# Patient Record
Sex: Male | Born: 1960 | Race: White | Hispanic: No | Smoking: Current every day smoker
Health system: Southern US, Community
[De-identification: ages and names within clinical notes are randomized; demographics above are authoritative.]

## PROBLEM LIST (undated history)

## (undated) DIAGNOSIS — R931 Abnormal findings on diagnostic imaging of heart and coronary circulation: Secondary | ICD-10-CM

## (undated) DIAGNOSIS — E785 Hyperlipidemia, unspecified: Secondary | ICD-10-CM

## (undated) DIAGNOSIS — I519 Heart disease, unspecified: Secondary | ICD-10-CM

## (undated) HISTORY — DX: Abnormal findings on diagnostic imaging of heart and coronary circulation: R93.1

## (undated) HISTORY — DX: Heart disease, unspecified: I51.9

## (undated) HISTORY — PX: VASECTOMY: SHX75

## (undated) HISTORY — DX: Hyperlipidemia, unspecified: E78.5

---

## 2005-09-04 ENCOUNTER — Ambulatory Visit: Payer: Self-pay | Admitting: Family Medicine

## 2005-10-06 ENCOUNTER — Ambulatory Visit: Payer: Self-pay | Admitting: Cardiology

## 2005-10-16 ENCOUNTER — Ambulatory Visit: Payer: Self-pay | Admitting: Cardiology

## 2006-05-06 ENCOUNTER — Ambulatory Visit: Payer: Self-pay | Admitting: Family Medicine

## 2007-08-11 ENCOUNTER — Ambulatory Visit: Payer: Self-pay | Admitting: Family Medicine

## 2008-03-16 ENCOUNTER — Ambulatory Visit: Payer: Self-pay | Admitting: Family Medicine

## 2008-08-21 ENCOUNTER — Ambulatory Visit: Payer: Self-pay | Admitting: Family Medicine

## 2008-09-11 ENCOUNTER — Ambulatory Visit: Payer: Self-pay | Admitting: Family Medicine

## 2009-01-03 ENCOUNTER — Ambulatory Visit: Payer: Self-pay | Admitting: Family Medicine

## 2009-10-28 ENCOUNTER — Ambulatory Visit: Payer: Self-pay | Admitting: Family Medicine

## 2009-11-19 ENCOUNTER — Ambulatory Visit: Payer: Self-pay | Admitting: Family Medicine

## 2009-11-20 ENCOUNTER — Encounter (INDEPENDENT_AMBULATORY_CARE_PROVIDER_SITE_OTHER): Payer: Self-pay | Admitting: *Deleted

## 2009-11-22 ENCOUNTER — Encounter (INDEPENDENT_AMBULATORY_CARE_PROVIDER_SITE_OTHER): Payer: Self-pay | Admitting: *Deleted

## 2009-11-28 ENCOUNTER — Ambulatory Visit: Payer: Self-pay | Admitting: Internal Medicine

## 2009-12-03 ENCOUNTER — Telehealth: Payer: Self-pay | Admitting: Internal Medicine

## 2010-04-16 ENCOUNTER — Ambulatory Visit
Admission: RE | Admit: 2010-04-16 | Discharge: 2010-04-16 | Payer: Self-pay | Source: Home / Self Care | Attending: Family Medicine | Admitting: Family Medicine

## 2010-05-01 NOTE — Letter (Signed)
Summary: [facility] Instructions  [facility]  [address] [place], [place] [address]   Phone: [phone]  Fax: [phone]       David Jensen    1961-02-14    MRN: [id number]        Procedure Day /Date: Thursday 12-05-09     Arrival Time: 8:30 a.m.     Procedure Time: 9:30 a.m.     Location of Procedure:                    _x_  [facility] (4th Floor)                        PREPARATION FOR COLONOSCOPY WITH MOVIPREP   Starting 5 days prior to your procedure 11-30-09 do not eat nuts, seeds, popcorn, corn, beans, peas,  salads, or any raw vegetables.  Do not take any fiber supplements (e.g. Metamucil, Citrucel, and Benefiber).  THE DAY BEFORE YOUR PROCEDURE         DATE:  12-04-09  DAY:  Wednesday  1.  Drink clear liquids the entire day-NO SOLID FOOD  2.  Do not drink anything colored red or purple.  Avoid juices with pulp.  No orange juice.  3.  Drink at least 64 oz. (8 glasses) of fluid/clear liquids during the day to prevent dehydration and help the prep work efficiently.  CLEAR LIQUIDS INCLUDE: Water Jello Ice Popsicles Tea (sugar ok, no milk/cream) Powdered fruit flavored drinks Coffee (sugar ok, no milk/cream) Gatorade Juice: apple, white grape, white cranberry  Lemonade Clear bullion, consomm, broth Carbonated beverages (any kind) Strained chicken noodle soup Hard Candy                             4.  In the morning, mix first dose of MoviPrep solution:    Empty 1 Pouch A and 1 Pouch B into the disposable container    Add lukewarm drinking water to the top line of the container. Mix to dissolve    Refrigerate (mixed solution should be used within 24 hrs)  5.  Begin drinking the prep at 5:00 p.m. The MoviPrep container is divided by 4 marks.   Every 15 minutes drink the solution down to the next mark (approximately 8 oz) until the full liter is complete.   6.  Follow completed prep with 16 oz of clear liquid of your choice  (Nothing red or purple).  Continue to drink clear liquids until bedtime.  7.  Before going to bed, mix second dose of MoviPrep solution:    Empty 1 Pouch A and 1 Pouch B into the disposable container    Add lukewarm drinking water to the top line of the container. Mix to dissolve    Refrigerate  THE DAY OF YOUR PROCEDURE      DATE:  12-05-09  DAY:  Thursday  Beginning at  4:30 a.m. (5 hours before procedure):         1. Every 15 minutes, drink the solution down to the next mark (approx 8 oz) until the full liter is complete.  2. Follow completed prep with 16 oz. of clear liquid of your choice.    3. You may drink clear liquids until  7:30 a.m. (2 HOURS BEFORE PROCEDURE).   MEDICATION INSTRUCTIONS  Unless otherwise instructed, you should take regular prescription medications with a small sip of water   as  early as possible the morning of your procedu      OTHER INSTRUCTIONS  You will need a responsible adult at least 51 years of age to accompany you and drive you home.   This person must remain in the waiting room during your procedure.  Wear loose fitting clothing that is easily removed.  Leave jewelry and other valuables at home.  However, you may wish to bring a book to read or  an iPod/MP3 player to listen to music as you wait for your procedure to start.  Remove all body piercing jewelry and leave at home.  Total time from sign-in until discharge is approximately 2-3 hours.  You should go home directly after your procedure and rest.  You can resume normal activities the  day after your procedure.  The day of your procedure you should not:   Drive   Make legal decisions   Operate machinery   Drink alcohol   Return to work  You will receive specific instructions about eating, activities and medications before you leave.    The above instructions have been reviewed and explained to me by   Doristine Church RN II  November 28, 2009 2:52 PM    I fully  understand and can verbalize these instructions _____________________________ Date _________

## 2010-05-01 NOTE — Progress Notes (Signed)
Summary: cx fee?  Phone Note Call from Patient   Caller: Patient Call For: Dr. Leone Payor Summary of Call: pt canceled COL sch'ed for 9/8 due to insurace coverage concern... pt states he tried calling yesterday, but our office was closed Dr. Leone Payor, do you wish to charge this pt a cx fee? Initial call taken by: Vallarie Mare,  December 03, 2009 9:56 AM  Follow-up for Phone Call        No but I want to know what the coverage issue is  to understand   is it deductible?  Follow-up by: Iva Boop MD, Clementeen Graham,  December 03, 2009 10:10 AM  Additional Follow-up for Phone Call Additional follow up Details #1::        Dr. Leone Payor, Coverage and benefits were verified and explained to pt by our office.  Pt has a $933 deductible and then his ins will only cover 70% of charges if the procedure becomes diagnostic.  Pt said he cannot afford his deductible and the possibility of only 70% coverage. We will not charge. Additional Follow-up by: Vallarie Mare,  December 04, 2009 8:27 AM    Additional Follow-up for Phone Call Additional follow up Details #2::    Patient NOT BILLED. Follow-up by: Leanor Kail [facility],  December 04, 2009 8:30 AM

## 2010-05-01 NOTE — Letter (Signed)
Summary: Previsit letter  [facility]  [address] [place], [place] [address]   Phone: [phone]  Fax: [phone]       11/20/2009 MRN: [id number]  David Jensen [address] [place], [place]  [address]  Dear [name],  Welcome to the Gastroenterology Division at [facility].    You are scheduled to see a nurse for your pre-procedure visit on 11-27-09 at 9:00a.m. on the 3rd floor at [facility], [address]  We ask that you try to arrive at our office 15 minutes prior to your appointment time to allow for check-in.  Your nurse visit will consist of discussing your medical and surgical history, your immediate family medical history, and your medications.    Please bring a complete list of all your medications or, if you prefer, bring the medication bottles and we will list them.  We will need to be aware of both prescribed and over the counter drugs.  We will need to know exact dosage information as well.  If you are on blood thinners (Coumadin, Plavix, Aggrenox, Ticlid, etc.) please call our office today/prior to your appointment, as we need to consult with your physician about holding your medication.   Please be prepared to read and sign documents such as consent forms, a financial agreement, and acknowledgement forms.  If necessary, and with your consent, a friend or relative is welcome to sit-in on the nurse visit with you.  Please bring your insurance card so that we may make a copy of it.  If your insurance requires a referral to see a specialist, please bring your referral form from your primary care physician.  No co-pay is required for this nurse visit.     If you cannot keep your appointment, please call [phone] to cancel or reschedule prior to your appointment date.  This allows [place] the opportunity to schedule an appointment for another patient in need of care.    Thank you for choosing [facility] for your medical  needs.  We appreciate the opportunity to care for you.  Please visit [place] at our website  to learn more about our practice.                     Sincerely.                                                                                                                   The Gastroenterology Division

## 2010-05-01 NOTE — Miscellaneous (Signed)
Summary: [facility] previsit  Clinical Lists Changes  Medications: Added new medication of MOVIPREP 100 GM  SOLR (PEG-KCL-NACL-NASULF-NA ASC-C) As per prep instructions. - Signed Rx of MOVIPREP 100 GM  SOLR (PEG-KCL-NACL-NASULF-NA ASC-C) As per prep instructions.;  #1 x 0;  Signed;  Entered by: Doristine Church RN II;  Authorized by: Iva Boop MD, [facility];  Method used: Electronically to [facility] [address], [place], [place]  [address], Ph: [phone], Fax: [phone] Allergies: Added new allergy or adverse reaction of CODEINE Observations: Added new observation of ALLERGY REV: Done (11/28/2009 14:30) Added new observation of NKA: F (11/28/2009 14:30)    Prescriptions: MOVIPREP 100 GM  SOLR (PEG-KCL-NACL-NASULF-NA ASC-C) As per prep instructions.  #1 x 0   Entered by:   Doristine Church RN II   Authorized by:   Iva Boop MD, [facility]   Signed by:   Doristine Church RN II on 11/28/2009   Method used:   Electronically to        [facility]* (retail)       [address]       [place], [place]  [address]       Ph: [phone]       Fax: [phone]   RxID:   [id number]

## 2010-08-15 NOTE — Procedures (Signed)
[facility]                                EXERCISE TREADMILL   NAME:David Jensen, David Jensen                       MRN:          [id number]  DATE:10/16/2005                            DOB:          Sep 30, 1960    PROCEDURE:  Exercise treadmill test.   OPERATOR:  Rollene Rotunda, M.D.   INDICATIONS FOR PROCEDURE:  A patient with chest discomfort and a family  history of early heart disease.   DESCRIPTION OF PROCEDURE:  The patient was exercised using the standard  Bruce protocol.  He was able to exercise for 10 minutes.  This was one  minute into stage 4.  He achieved 11.7 METS.  He achieved a peak heart rate  of 166, which is 95% of predicted.  The test was terminated because of  shortness of breath and because he had achieved his target heart rate.  He  did not have any chest discomfort.  He did have an appropriate blood  pressure response with a maximum of 151/76.  There was no ectopy noted.  He  did have some very slight 0.5 mm ST segment depression that was slightly up-  sloping in lead II and aVF, horizontal only in lead III.  It did not meet  criteria for an ischemic response.  He had a normal heart rate in recovery.   CONCLUSION:  Negative adequate exercise treadmill test.  Good exercise  tolerance.  No evidence of high-grade obstructive coronary disease.  Low  risk stratification, based on this test.   PLAN:  Given this test, the patient is in the lower risk category for future  cardiovascular events; however, he does have a strong family history with  his father having heart disease in his 91's and dying suddenly in his early  49's.  Given this, we have discussed at great length pimary risk reduction.  I have given him a prescription for exercise based on this study.  I have  also suggested Lipitor, which he is using.  He knows that he needs a lipid  profile in about eight weeks with liver enzymes.  I have  goal of less than  100.   FOLLOWUP:   I would like to see him back in one month or sooner if needed.                                   Rollene Rotunda, MD, [facility]   JH/MedQ  DD:  10/16/2005  DT:  10/16/2005  Job #:  [id number]   cc:   Sharlot Gowda, MD

## 2010-08-15 NOTE — Assessment & Plan Note (Signed)
[facility]                              CARDIOLOGY OFFICE NOTE   FAIZ, WEBER                       MRN:          [id number]  DATE:10/06/2005                            DOB:          22-Nov-1960    REFERRING PHYSICIAN:  Carollee Herter   PRIMARY CARE PHYSICIAN:  Sharlot Gowda, MD   REASON FOR REFERRAL:  Evaluate patient with a family history of coronary  artery disease and an abnormal EKG.   HISTORY OF PRESENT ILLNESS:  The patient is a very pleasant 50 year old  white gentleman, no prior cardiac history.  His father did die at age 29 of  a myocardial infarction and he had heart disease before that.  This  gentleman does not exercise routinely, though he has what he describes as a  sometimes active job.  He will climb ladders, amongst his activities.  He  denies any chest discomfort, neck discomfort, arm discomfort, activity-  induced nausea and vomiting, excessive diaphoresis.  He has had no  palpitations, presyncope or syncope.  He denies any PND or orthopnea.   PAST MEDICAL HISTORY:  He says he had a recent lipid profile and thinks it  was okay but does not remember the numbers.  He has had no history of  diabetes or hypertension.   PAST SURGICAL HISTORY:  Vasectomy.   ALLERGIES:  CODEINE.   MEDICATIONS:  Advil.   SOCIAL HISTORY:  The patient quit smoking in 1986 after smoking for about 10  years.  He does smoke a pipe currently.  He is married.  He has 2 teenage  children.  He is a Surveyor, minerals.  He likes to golf.   FAMILY HISTORY:  Positive for coronary disease in his father as described.   REVIEW OF SYSTEMS:  Negative for all other systems.   PHYSICAL EXAMINATION:  GENERAL:  The patient is in no distress.  VITAL SIGNS:  Blood pressure 120/84, heart rate 93 and regular, weight 215  pounds, body mass index 27.  HEENT:  Eyelids unremarkable.  Pupils equal, round, and reactive to light.  Fundi within normal limits.  Oral  mucosa normal.  NECK:  No jugular venous distention, wave form within normal limits, carotid  upstroke brisk and symmetric, no bruits, no thyromegaly.  LYMPHATIC:  No cervical, axillary or inguinal adenopathy.  LUNGS:  Clear to auscultation bilaterally.  BACK:  No costovertebral angle tenderness.  CHEST:  Unremarkable.  CARDIAC:  PMI not displaced or sustained, S1 and S2 within normal limits, no  S3, no S4, no murmurs.  ABDOMEN:  Flat, positive bowel sounds, normal in frequency and pitch, no  bruits, rebound, guarding, no midline pulsatile mass, no hepatomegaly, no  splenomegaly.  SKIN:  No rashes, no nodules.  EXTREMITIES:  2+ pulses throughout.  No edema, no cyanosis, no clubbing.  NEUROLOGIC:  Oriented to person, place, time.  Cranial nerves II-XII grossly  intact.  Motor grossly intact.   EKG:  Sinus rhythm, rate 93, axis within normal limits, intervals within  normal limits, early repolarization pattern, no acute ST-T wave changes.   ASSESSMENT  AND PLAN:  1.  Cardiovascular risk.  The patient does have significant family history.      He does not exercise.  He has no objective findings of ischemia or      symptoms suggestive of a high-grade obstructive lesion.  However, an      exercise treadmill test is indicated for screening and risk reduction.      I am also with this going to give him an exercise regimen.  He will be      scheduled to come back for this.  I do think his EKG represents      repolarization changes but not an acute finding.  I do think he will      benefit substantially from the above-mentioned screening and plans for      preventive maintenance.  2.  Weight.  We discussed the fact that his body mass index is above 27 and      we discussed goals for mild weight loss.  3.  Tobacco.  I have asked him to avoid all tobacco products.  4.  Lipids.  I will review his lipid profile when he brings it back at the      time of his treadmill.  Given his family history, I  would suggest an      aggressive goal  of an LDL less than 100 and an HDL greater than 40.                               Rollene Rotunda, MD, [facility]    JH/MedQ  DD:  10/06/2005  DT:  10/06/2005  Job #:  [id number]   cc:   Sharlot Gowda, MD

## 2010-12-19 ENCOUNTER — Telehealth: Payer: Self-pay | Admitting: Family Medicine

## 2010-12-19 DIAGNOSIS — E785 Hyperlipidemia, unspecified: Secondary | ICD-10-CM

## 2010-12-19 MED ORDER — ATORVASTATIN CALCIUM 20 MG PO TABS: 20.0000 mg | ORAL_TABLET | Freq: Every day | ORAL | Status: DC

## 2010-12-19 NOTE — Telephone Encounter (Signed)
Received refill request for patient's atorvastatin 20mg , called patient and let him know that I was going to call him in one month's supply as he was due for CPE with fasting labs in August. Patient states he will check his schedule and call me back later today to schedule CPE with Dr.Lalonde. I called in atorvastain 20mg  #30 qd with 0rf to [facility].

## 2011-01-09 ENCOUNTER — Encounter: Payer: Self-pay | Admitting: Family Medicine

## 2011-01-21 ENCOUNTER — Other Ambulatory Visit: Payer: Self-pay | Admitting: Family Medicine

## 2011-01-21 DIAGNOSIS — E785 Hyperlipidemia, unspecified: Secondary | ICD-10-CM

## 2011-01-21 MED ORDER — ATORVASTATIN CALCIUM 20 MG PO TABS: 20.0000 mg | ORAL_TABLET | Freq: Every day | ORAL | Status: DC

## 2011-01-21 NOTE — Telephone Encounter (Signed)
He needs to come in for blood work 

## 2011-01-21 NOTE — Telephone Encounter (Signed)
Patient scheduled Physical for 02/11/11, needs Lipitor refill on a few more pills left

## 2011-02-11 ENCOUNTER — Ambulatory Visit (INDEPENDENT_AMBULATORY_CARE_PROVIDER_SITE_OTHER): Payer: BC Managed Care – PPO | Admitting: Family Medicine

## 2011-02-11 ENCOUNTER — Encounter: Payer: Self-pay | Admitting: Family Medicine

## 2011-02-11 VITALS — BP 122/82 | HR 80 | Ht 75.5 in | Wt 209.0 lb

## 2011-02-11 DIAGNOSIS — E785 Hyperlipidemia, unspecified: Secondary | ICD-10-CM

## 2011-02-11 DIAGNOSIS — Z0000 Encounter for general adult medical examination without abnormal findings: Secondary | ICD-10-CM

## 2011-02-11 DIAGNOSIS — F909 Attention-deficit hyperactivity disorder, unspecified type: Secondary | ICD-10-CM | POA: Insufficient documentation

## 2011-02-11 DIAGNOSIS — R4702 Dysphasia: Secondary | ICD-10-CM

## 2011-02-11 DIAGNOSIS — Z8249 Family history of ischemic heart disease and other diseases of the circulatory system: Secondary | ICD-10-CM

## 2011-02-11 DIAGNOSIS — Z125 Encounter for screening for malignant neoplasm of prostate: Secondary | ICD-10-CM

## 2011-02-11 DIAGNOSIS — Z23 Encounter for immunization: Secondary | ICD-10-CM

## 2011-02-11 DIAGNOSIS — R4789 Other speech disturbances: Secondary | ICD-10-CM

## 2011-02-11 LAB — POCT URINALYSIS DIPSTICK
Bilirubin, UA: NEGATIVE
Blood, UA: NEGATIVE
Glucose, UA: NEGATIVE
Ketones, UA: NEGATIVE
Leukocytes, UA: NEGATIVE
Nitrite, UA: NEGATIVE
Protein, UA: NEGATIVE
Spec Grav, UA: 1.025
Urobilinogen, UA: NEGATIVE
pH, UA: 5

## 2011-02-11 MED ORDER — METHYLPHENIDATE HCL ER (LA) 30 MG PO CP24: 30.0000 mg | ORAL_CAPSULE | ORAL | Status: DC

## 2011-02-11 NOTE — Patient Instructions (Signed)
I will call you with the results of the blood work.  

## 2011-02-11 NOTE — Progress Notes (Signed)
Subjective:    Patient ID: David Jensen, male    DOB: Apr 06, 1960, 50 y.o.   MRN: [id number]  HPI He has a two-week history of clicking sensation when he swallows but it does not bother him when he swallows liquids or solids him in mainly with clearing his throat. He also has some left-sided neck pain unrelated to swallowing. Breathing and movement make no difference. He has a previous history of ADD as well as 2 of his children having this. He did try Ritalin 20 mg but states it did not help much. I can have some on hand for occasions that he needs to stay focused for the He also has some skin lesions that he would like to evaluate continues on Lipitor. His marriage is stable. His 2 children are now out of the home.   Review of Systems  Constitutional: Negative.   HENT: Positive for neck pain.   Eyes: Negative.   Respiratory: Negative.   Cardiovascular: Negative.   Genitourinary: Negative.   Skin: Negative.   Psychiatric/Behavioral: Negative.        Objective:   Physical Exam BP 122/82  Pulse 80  Ht 6' 3.5" (1.918 m)  Wt 209 lb (94.802 kg)  BMI 25.78 kg/m2  General Appearance:    Alert, cooperative, no distress, appears stated age  Head:    Normocephalic, without obvious abnormality, atraumatic  Eyes:    PERRL, conjunctiva/corneas clear, EOM's intact, fundi    benign  Ears:    Normal TM's and external ear canals  Nose:   Nares normal, mucosa normal, no drainage or sinus   tenderness  Throat:   Lips, mucosa, and tongue normal; teeth and gums normal  Neck:   Supple, no lymphadenopathy;  thyroid:  no   enlargement/tenderness/nodules; no carotid   bruit or JVD  Back:    Spine nontender, no curvature, ROM normal, no CVA     tenderness  Lungs:     Clear to auscultation bilaterally without wheezes, rales or     ronchi; respirations unlabored  Chest Wall:    No tenderness or deformity   Heart:    Regular rate and rhythm, S1 and S2 normal, no murmur, rub   or gallop  Breast Exam:     No chest wall tenderness, masses or gynecomastia  Abdomen:     Soft, non-tender, nondistended, normoactive bowel sounds,    no masses, no hepatosplenomegaly  Genitalia:   deferred   Rectal:   deferred   Extremities:   No clubbing, cyanosis or edema  Pulses:   2+ and symmetric all extremities  Skin:   Skin color, texture, turgor normal, no rashes or lesions  Lymph nodes:   Cervical, supraclavicular, and axillary nodes normal  Neurologic:   CNII-XII intact, normal strength, sensation and gait; reflexes 2+ and symmetric throughout          Psych:   Normal mood, affect, hygiene and grooming.          Assessment & Plan:   1. Dysphasia  Ambulatory referral to Gastroenterology  2. ADD (attention deficit disorder with hyperactivity)    3. Special screening for malignant neoplasm of prostate  PSA  4. Family history of heart disease in male family member before age 67  CBC with Differential, Comprehensive metabolic panel, Ambulatory referral to Cardiology  5. Hyperlipidemia LDL goal <100  Lipid panel  6. Routine general medical examination at a health care facility  HM COLONOSCOPY, PSA, CBC with Differential, Comprehensive metabolic panel,  Lipid panel   prescription for Ritalin written. He will let me know how this works.

## 2011-02-11 NOTE — Progress Notes (Signed)
Addended by: Barbette Or A on: 02/11/2011 12:37 PM   Modules accepted: Orders

## 2011-02-12 LAB — CBC WITH DIFFERENTIAL/PLATELET
HCT: 49 % (ref 39.0–52.0)
Hemoglobin: 16.5 g/dL (ref 13.0–17.0)
Lymphocytes Relative: 23 % (ref 12–46)
Lymphs Abs: 1.2 10*3/uL (ref 0.7–4.0)
MCHC: 33.7 g/dL (ref 30.0–36.0)
Monocytes Absolute: 0.4 10*3/uL (ref 0.1–1.0)
Monocytes Relative: 8 % (ref 3–12)
Neutro Abs: 3.6 10*3/uL (ref 1.7–7.7)
Neutrophils Relative %: 66 % (ref 43–77)
RBC: 4.75 MIL/uL (ref 4.22–5.81)
WBC: 5.5 10*3/uL (ref 4.0–10.5)

## 2011-02-12 LAB — LIPID PANEL
HDL: 56 mg/dL (ref >39)
LDL Cholesterol: 95 mg/dL (ref 0–99)
Triglycerides: 61 mg/dL (ref <150)
VLDL: 12 mg/dL (ref 0–40)

## 2011-02-12 LAB — PSA: PSA: 0.34 ng/mL (ref <=4.00)

## 2011-02-12 LAB — COMPREHENSIVE METABOLIC PANEL
Albumin: 4.5 g/dL (ref 3.5–5.2)
BUN: 16 mg/dL (ref 6–23)
CO2: 26 mEq/L (ref 19–32)
Calcium: 9.4 mg/dL (ref 8.4–10.5)
Chloride: 104 mEq/L (ref 96–112)
Glucose, Bld: 85 mg/dL (ref 70–99)
Potassium: 4.5 mEq/L (ref 3.5–5.3)
Sodium: 139 mEq/L (ref 135–145)
Total Protein: 6.9 g/dL (ref 6.0–8.3)

## 2011-02-18 ENCOUNTER — Telehealth: Payer: Self-pay | Admitting: Family Medicine

## 2011-02-18 NOTE — Telephone Encounter (Signed)
DONE

## 2011-03-03 ENCOUNTER — Other Ambulatory Visit: Payer: Self-pay | Admitting: Internal Medicine

## 2011-03-03 DIAGNOSIS — E785 Hyperlipidemia, unspecified: Secondary | ICD-10-CM

## 2011-03-03 MED ORDER — ATORVASTATIN CALCIUM 20 MG PO TABS: 20.0000 mg | ORAL_TABLET | Freq: Every day | ORAL | Status: DC

## 2011-03-19 ENCOUNTER — Ambulatory Visit (INDEPENDENT_AMBULATORY_CARE_PROVIDER_SITE_OTHER): Payer: BC Managed Care – PPO | Admitting: Cardiology

## 2011-03-19 ENCOUNTER — Encounter: Payer: Self-pay | Admitting: Cardiology

## 2011-03-19 VITALS — BP 142/92 | HR 106 | Resp 16 | Ht 75.0 in | Wt 212.0 lb

## 2011-03-19 DIAGNOSIS — R9431 Abnormal electrocardiogram [ECG] [EKG]: Secondary | ICD-10-CM

## 2011-03-19 NOTE — Patient Instructions (Addendum)
Your physician has requested that you have cardiac Calcium Score. Cardiac computed tomography (CT) is a painless test that uses an x-ray machine to take clear, detailed pictures of your heart. For further information please visit [email or web address] Please follow instruction sheet as given.  Please have blood work today (TSH)  Follow up will be based on the results of your testing.

## 2011-03-19 NOTE — Assessment & Plan Note (Signed)
He is slightly tachycardic. I will check a TSH.

## 2011-03-19 NOTE — Assessment & Plan Note (Signed)
I reviewed his LDL which is 95. HDL was 56. Think is a reasonable ratio and he will continue meds as listed.

## 2011-03-19 NOTE — Assessment & Plan Note (Signed)
I think a reasonable screen with this gentleman would be a coronary calcium score. We discussed this and primary risk reduction.

## 2011-03-19 NOTE — Progress Notes (Signed)
    HPI The patient presents for followup of cardiovascular risk factors. I saw him in 2007. He had an exercise treadmill test which was normal. Since that time he's had no acute cardiovascular events. However, he has significant cardiovascular risk factors with dyslipidemia and a family history. He doesn't exercise routinely though he's active walking stairs and doing daily living activities. The patient denies any new symptoms such as chest discomfort, neck or arm discomfort. There has been no new shortness of breath, PND or orthopnea. There have been no reported palpitations, presyncope or syncope.    Allergies  Allergen Reactions  . Codeine     REACTION: n/v    Current Outpatient Prescriptions  Medication Sig Dispense Refill  . atorvastatin (LIPITOR) 20 MG tablet Take 1 tablet (20 mg total) by mouth daily.  30 tablet  5  . Multiple Vitamins-Minerals (MULTIVITAMIN WITH MINERALS) tablet Take 1 tablet by mouth daily.          Past Medical History  Diagnosis Date  . Dyslipidemia   . Cardiac disease     FAMILY HX    Past Surgical History  Procedure Date  . Vasectomy     Family History  Problem Relation Age of Onset  . Heart disease Father 52    Died with an MI  . Atrial fibrillation Mother     History   Social History  . Marital Status: Married    Spouse Name: N/A    Number of Children: 2  . Years of Education: N/A   Occupational History  . Realtor    Social History Main Topics  . Smoking status: Current Everyday Smoker    Types: Pipe  . Smokeless tobacco: Not on file  . Alcohol Use: Yes  . Drug Use: No  . Sexually Active: Yes   Other Topics Concern  . Not on file   Social History Narrative  . No narrative on file    ROS:  As stated in the HPI and negative for all other systems.  PHYSICAL EXAM BP 142/92  Pulse 106  Resp 16  Ht 6\' 3"  (1.905 m)  Wt 212 lb (96.163 kg)  BMI 26.50 kg/m2 GENERAL:  Well appearing HEENT:  Pupils equal round and reactive,  fundi not visualized, oral mucosa unremarkable NECK:  No jugular venous distention, waveform within normal limits, carotid upstroke brisk and symmetric, no bruits, no thyromegaly LYMPHATICS:  No cervical, inguinal adenopathy LUNGS:  Clear to auscultation bilaterally BACK:  No CVA tenderness CHEST:  Unremarkable HEART:  PMI not displaced or sustained,S1 and S2 within normal limits, no S3, no S4, no clicks, no rubs, no murmurs ABD:  Flat, positive bowel sounds normal in frequency in pitch, no bruits, no rebound, no guarding, no midline pulsatile mass, no hepatomegaly, no splenomegaly EXT:  2 plus pulses throughout, no edema, no cyanosis no clubbing SKIN:  No rashes no nodules NEURO:  Cranial nerves II through XII grossly intact, motor grossly intact throughout [facility]:  Cognitively intact, oriented to person place and time  EKG:   Sinus rhythm, rate 104, poor anterior R wave progression, no acute ST-T wave changes, rightward axis 03/19/2011   ASSESSMENT AND PLAN

## 2011-04-11 ENCOUNTER — Telehealth: Payer: Self-pay | Admitting: Cardiology

## 2011-04-11 NOTE — Telephone Encounter (Signed)
Message copied by Rollene Rotunda on Sat Apr 11, 2011 11:51 AM ------      Message from: FLEMING, PAMELA J      Created: Thu Apr 09, 2011  8:40 AM      Regarding: FW: CT CALCIUM SORE                   ----- Message -----         From: Connye Burkitt, NT         Sent: 04/08/2011  10:51 AM           To: Rocco Serene, RN      Subject: FW: CT CALCIUM SORE                                      I called David Jensen this morning to see if he wanted to schedule Cardiac CT Calcium Score. Patient has decided not to have study at this time due to cost. Patient states he just can't afford it right now.      ----- Message -----         From: Connye Burkitt, NT         Sent: 03/19/2011   4:38 PM           To: Rocco Serene, RN, Connye Burkitt, NT      Subject: RE: CT CALCIUM SORE                                                  ----- Message -----         From: Connye Burkitt, NT         Sent: 03/19/2011   3:05 PM           To: Rocco Serene, RN, Connye Burkitt, NT      Subject: CT CALCIUM SORE                                          PATIENT WILL CALL ME BACK TO SCHEDULE CT.

## 2011-05-22 LAB — HM COLONOSCOPY

## 2011-06-19 ENCOUNTER — Telehealth: Payer: Self-pay | Admitting: Family Medicine

## 2011-06-19 MED ORDER — METHYLPHENIDATE HCL ER (LA) 30 MG PO CP24: 30.0000 mg | ORAL_CAPSULE | ORAL | Status: DC

## 2011-06-19 NOTE — Telephone Encounter (Signed)
DONE

## 2011-06-19 NOTE — Telephone Encounter (Signed)
Return in [place] written.

## 2011-09-28 ENCOUNTER — Other Ambulatory Visit: Payer: Self-pay

## 2011-09-28 DIAGNOSIS — E785 Hyperlipidemia, unspecified: Secondary | ICD-10-CM

## 2011-09-28 MED ORDER — ATORVASTATIN CALCIUM 20 MG PO TABS: 20.0000 mg | ORAL_TABLET | Freq: Every day | ORAL | Status: DC

## 2011-11-04 ENCOUNTER — Telehealth: Payer: Self-pay | Admitting: Internal Medicine

## 2011-11-04 ENCOUNTER — Encounter: Payer: Self-pay | Admitting: Family Medicine

## 2011-11-04 ENCOUNTER — Ambulatory Visit (INDEPENDENT_AMBULATORY_CARE_PROVIDER_SITE_OTHER): Payer: BC Managed Care – PPO | Admitting: Family Medicine

## 2011-11-04 VITALS — BP 130/80 | HR 99 | Wt 205.0 lb

## 2011-11-04 DIAGNOSIS — F909 Attention-deficit hyperactivity disorder, unspecified type: Secondary | ICD-10-CM

## 2011-11-04 DIAGNOSIS — Z79899 Other long term (current) drug therapy: Secondary | ICD-10-CM

## 2011-11-04 DIAGNOSIS — E785 Hyperlipidemia, unspecified: Secondary | ICD-10-CM

## 2011-11-04 DIAGNOSIS — Z8249 Family history of ischemic heart disease and other diseases of the circulatory system: Secondary | ICD-10-CM

## 2011-11-04 DIAGNOSIS — L989 Disorder of the skin and subcutaneous tissue, unspecified: Secondary | ICD-10-CM

## 2011-11-04 LAB — LIPID PANEL
Cholesterol: 170 mg/dL (ref 0–200)
HDL: 50 mg/dL (ref >39)
LDL Cholesterol: 107 mg/dL — ABNORMAL HIGH (ref 0–99)
Total CHOL/HDL Ratio: 3.4 Ratio
Triglycerides: 67 mg/dL (ref <150)
VLDL: 13 mg/dL (ref 0–40)

## 2011-11-04 LAB — COMPREHENSIVE METABOLIC PANEL
ALT: 13 U/L (ref 0–53)
AST: 15 U/L (ref 0–37)
Alkaline Phosphatase: 50 U/L (ref 39–117)
BUN: 15 mg/dL (ref 6–23)
Calcium: 9.4 mg/dL (ref 8.4–10.5)
Creat: 0.93 mg/dL (ref 0.50–1.35)
Total Bilirubin: 0.7 mg/dL (ref 0.3–1.2)

## 2011-11-04 MED ORDER — ATORVASTATIN CALCIUM 20 MG PO TABS: 20.0000 mg | ORAL_TABLET | Freq: Every day | ORAL | Status: DC

## 2011-11-04 NOTE — Telephone Encounter (Signed)
Pt needs a refill on lipitor 20mg 

## 2011-11-04 NOTE — Progress Notes (Signed)
  Subjective:    Patient ID: David Jensen, male    DOB: 12-Jun-1960, 51 y.o.   MRN: [id number]  HPI He is here for medication check and also to have lesions on his upper for head evaluated. He does have underlying ED and does occasionally take Ritalin. He would like a refill. This is rarely used. His family history is significant for heart disease. He would like a refill on his statin drug. He has no other concerns or complaints.   Review of Systems     Objective:   Physical Exam Alert and in no distress. Exam of the right upper forehead area shows 3 one and half centimeter pigmented lesions with well defined borders and consistent pigment.       Assessment & Plan:   1. ADHD (attention deficit hyperactivity disorder)    2. Family history of heart disease in male family member before age 8    3. Hyperlipidemia LDL goal <100  Lipid panel  4. Lesion of skin of face    5. Encounter for long-term (current) use of other medications  Lipid panel, Comprehensive metabolic panel   I reassured him that the lesions were benign and no therapy needed to be done. I will renew his Ritalin. Did recommend he come back within the next year for complete examination.

## 2011-11-04 NOTE — Telephone Encounter (Signed)
Med sent in.

## 2011-11-06 ENCOUNTER — Telehealth: Payer: Self-pay | Admitting: Family Medicine

## 2011-11-12 ENCOUNTER — Telehealth: Payer: Self-pay | Admitting: Family Medicine

## 2011-11-12 NOTE — Telephone Encounter (Signed)
LM

## 2012-04-27 ENCOUNTER — Encounter: Payer: Self-pay | Admitting: Medical

## 2012-04-27 ENCOUNTER — Ambulatory Visit (INDEPENDENT_AMBULATORY_CARE_PROVIDER_SITE_OTHER): Payer: BC Managed Care – PPO | Admitting: Medical

## 2012-04-27 VITALS — BP 138/80 | HR 115 | Temp 99.2°F | Resp 16 | Wt 212.0 lb

## 2012-04-27 DIAGNOSIS — R509 Fever, unspecified: Secondary | ICD-10-CM

## 2012-04-27 DIAGNOSIS — R05 Cough: Secondary | ICD-10-CM

## 2012-04-27 DIAGNOSIS — J329 Chronic sinusitis, unspecified: Secondary | ICD-10-CM

## 2012-04-27 DIAGNOSIS — R059 Cough, unspecified: Secondary | ICD-10-CM

## 2012-04-27 MED ORDER — AZITHROMYCIN 250 MG PO TABS: ORAL_TABLET | ORAL | Status: DC

## 2012-04-27 NOTE — Progress Notes (Signed)
Subjective:  David Jensen is a 52 y.o. male who presents for illness.  His wife is here being seen for the same today.  He reports 1+ week now of worsening illness.  He reports ongoing cough, chest and head congestion, cough productive of green and yellow phlegm consistently now, sinus pressure, fever, throat irritated, no improving despite waiting it out and using OTC Alka Seltzer medication.   Wife has similar symptoms x 2wk and she is not better either.  He is a nonsmoker, no hx/o lung dz or asthma.   No SOB, wheezing, CP, edema. No other aggravating or relieving factors.  No other c/o.  The following portions of the patient's history were reviewed and updated as appropriate: allergies, current medications, past family history, past medical history, past social history, past surgical history and problem list.  Past Medical History  Diagnosis Date  . Dyslipidemia   . Cardiac disease     FAMILY HX   ROS as in subjective  Objective: Vital signs reviewed  General appearance: Alert, WD/WN, no distress, ill appearing                             Skin: warm, no rash, no diaphoresis                           Head: mild sinus tenderness                            Eyes: conjunctiva normal, corneas clear, PERRLA                            Ears: bilat TMs with mild erythema, external ear canals normal                          Nose: septum midline, turbinates swollen, with erythema and clear discharge             Mouth/throat: MMM, tongue normal, mild pharyngeal erythema                           Neck: supple, no adenopathy, no thyromegaly, nontender                          Heart: mildly tachycardic otherwise RRR, normal S1, S2, no murmurs                         Lungs: +scattered rhonchi, no wheezes, no rales, I>E, no dullness to percussion                Extremities: no edema, nontender     Assessment and Plan: Encounter Diagnoses  Name Primary?  . Fever Yes  . Sinusitis   . Cough     Prescription given today for Azithromycin as below.  Discussed diagnosis and treatment of sinus infection, respiratory infection, possibly early lung infection, but no obvious lung exam suggestive of frank pneumonia.  Advised Mucinex DM instead of Alka Seltzer and avoid OTC decongestants and sudafed.  Tylenol or Ibuprofen OTC for fever and malaise.  Call/return in 2-3 days if symptoms are worse or not improving.

## 2012-04-29 ENCOUNTER — Telehealth: Payer: Self-pay | Admitting: Family Medicine

## 2012-04-29 NOTE — Telephone Encounter (Signed)
Patient is aware to stop his cholesterol medication while on the antibiotic. CLS

## 2012-04-29 NOTE — Telephone Encounter (Signed)
Message copied by Janeice Robinson on Fri Apr 29, 2012  4:56 PM ------      Message from: Aleen Campi, DAVID S      Created: Fri Apr 29, 2012  7:41 AM       Call and see if doing any better?  Also, I forgot to mention this, but having him stop his Lipitor/choleserol medication while taking this antibiotic.  Once he has been off the antibiotic 2-3 days, can restart the lipitor.

## 2012-06-16 ENCOUNTER — Encounter: Payer: Self-pay | Admitting: Family Medicine

## 2012-06-16 ENCOUNTER — Ambulatory Visit (INDEPENDENT_AMBULATORY_CARE_PROVIDER_SITE_OTHER): Payer: BC Managed Care – PPO | Admitting: Family Medicine

## 2012-06-16 VITALS — BP 112/70 | HR 110 | Wt 208.0 lb

## 2012-06-16 DIAGNOSIS — E785 Hyperlipidemia, unspecified: Secondary | ICD-10-CM

## 2012-06-16 DIAGNOSIS — F909 Attention-deficit hyperactivity disorder, unspecified type: Secondary | ICD-10-CM

## 2012-06-16 MED ORDER — ATORVASTATIN CALCIUM 20 MG PO TABS: 20.0000 mg | ORAL_TABLET | Freq: Every day | ORAL | Status: DC

## 2012-06-16 MED ORDER — METHYLPHENIDATE HCL ER (LA) 30 MG PO CP24: 30.0000 mg | ORAL_CAPSULE | ORAL | Status: DC

## 2012-06-16 NOTE — Progress Notes (Signed)
  Subjective:    Patient ID: David Jensen, male    DOB: April 11, 1960, 52 y.o.   MRN: [id number]  HPI Here for medication check. He does use Ritalin LA but very sparingly. 30 has lasted him a year. He gets roughly 8 hours of benefit out of it. It helps him stay focused and on task.he cannot really tell when it wears off.he has no particular concerns or complaints about this medication. He also needs a refill on his statin. His last blood work was August and looked pretty good.   Review of Systems     Objective:   Physical Exam Alert and in no distress.       Assessment & Plan:  Dyslipidemia - Plan: atorvastatin (LIPITOR) 20 MG tablet  ADHD (attention deficit hyperactivity disorder) - Plan: methylphenidate (RITALIN LA) 30 MG 24 hr capsule he is to return here sometime in August or September for a medication check.

## 2012-06-16 NOTE — Patient Instructions (Signed)
Take the Ritalin as needed for your ADD. Use the Prilosec as needed. If you can get by every other day or every 3-4 days he would

## 2012-09-10 ENCOUNTER — Telehealth: Payer: Self-pay | Admitting: Family Medicine

## 2012-09-12 ENCOUNTER — Telehealth: Payer: Self-pay | Admitting: Family Medicine

## 2012-09-12 NOTE — Telephone Encounter (Signed)
LM

## 2012-09-23 NOTE — Telephone Encounter (Signed)
LM

## 2012-10-21 ENCOUNTER — Encounter (HOSPITAL_COMMUNITY): Payer: Self-pay | Admitting: Emergency Medicine

## 2012-10-21 ENCOUNTER — Other Ambulatory Visit: Payer: Self-pay | Admitting: Emergency Medicine

## 2013-01-09 ENCOUNTER — Telehealth: Payer: Self-pay | Admitting: Family Medicine

## 2013-01-09 ENCOUNTER — Other Ambulatory Visit: Payer: Self-pay | Admitting: Medical

## 2013-01-09 DIAGNOSIS — E785 Hyperlipidemia, unspecified: Secondary | ICD-10-CM

## 2013-01-09 MED ORDER — ATORVASTATIN CALCIUM 20 MG PO TABS: 20.0000 mg | ORAL_TABLET | Freq: Every day | ORAL | Status: DC

## 2013-01-09 NOTE — Telephone Encounter (Signed)
Give him enough to cover to him come in

## 2013-01-16 ENCOUNTER — Telehealth: Payer: Self-pay | Admitting: Family Medicine

## 2013-01-16 NOTE — Telephone Encounter (Signed)
LEFT MESSAGE ON PT HOME # IF HIS INS. WILL PAY DR.LALONDE WILL GIVE SHINGLES SHOT TO HIM

## 2013-01-16 NOTE — Telephone Encounter (Signed)
You are correct in assuming is an insurance issue. If insurance covers it I will give it to him,

## 2013-02-02 ENCOUNTER — Encounter: Payer: Self-pay | Admitting: Family Medicine

## 2013-02-02 ENCOUNTER — Ambulatory Visit (INDEPENDENT_AMBULATORY_CARE_PROVIDER_SITE_OTHER): Payer: BC Managed Care – PPO | Admitting: Family Medicine

## 2013-02-02 VITALS — BP 128/88 | HR 106 | Ht 74.5 in | Wt 202.0 lb

## 2013-02-02 DIAGNOSIS — L821 Other seborrheic keratosis: Secondary | ICD-10-CM

## 2013-02-02 DIAGNOSIS — Z23 Encounter for immunization: Secondary | ICD-10-CM

## 2013-02-02 DIAGNOSIS — Z8249 Family history of ischemic heart disease and other diseases of the circulatory system: Secondary | ICD-10-CM

## 2013-02-02 DIAGNOSIS — K219 Gastro-esophageal reflux disease without esophagitis: Secondary | ICD-10-CM

## 2013-02-02 DIAGNOSIS — E785 Hyperlipidemia, unspecified: Secondary | ICD-10-CM

## 2013-02-02 DIAGNOSIS — Z0000 Encounter for general adult medical examination without abnormal findings: Secondary | ICD-10-CM

## 2013-02-02 DIAGNOSIS — F909 Attention-deficit hyperactivity disorder, unspecified type: Secondary | ICD-10-CM

## 2013-02-02 LAB — CBC WITH DIFFERENTIAL/PLATELET
Basophils Absolute: 0.1 10*3/uL (ref 0.0–0.1)
Eosinophils Relative: 3 % (ref 0–5)
HCT: 46 % (ref 39.0–52.0)
Hemoglobin: 16.4 g/dL (ref 13.0–17.0)
Lymphocytes Relative: 21 % (ref 12–46)
Lymphs Abs: 1.6 10*3/uL (ref 0.7–4.0)
MCV: 96.8 fL (ref 78.0–100.0)
Monocytes Absolute: 0.5 10*3/uL (ref 0.1–1.0)
Monocytes Relative: 6 % (ref 3–12)
Neutro Abs: 5.4 10*3/uL (ref 1.7–7.7)
RBC: 4.75 MIL/uL (ref 4.22–5.81)
WBC: 7.8 10*3/uL (ref 4.0–10.5)

## 2013-02-02 LAB — LIPID PANEL
Cholesterol: 172 mg/dL (ref 0–200)
HDL: 61 mg/dL (ref >39)

## 2013-02-02 LAB — COMPREHENSIVE METABOLIC PANEL
AST: 18 U/L (ref 0–37)
Albumin: 4.4 g/dL (ref 3.5–5.2)
BUN: 17 mg/dL (ref 6–23)
CO2: 29 mEq/L (ref 19–32)
Calcium: 9.8 mg/dL (ref 8.4–10.5)
Chloride: 100 mEq/L (ref 96–112)
Creat: 1.07 mg/dL (ref 0.50–1.35)
Potassium: 5 mEq/L (ref 3.5–5.3)

## 2013-02-02 MED ORDER — ATORVASTATIN CALCIUM 20 MG PO TABS: 20.0000 mg | ORAL_TABLET | Freq: Every day | ORAL | Status: DC

## 2013-02-02 MED ORDER — METHYLPHENIDATE HCL ER (LA) 30 MG PO CP24: 30.0000 mg | ORAL_CAPSULE | ORAL | Status: DC

## 2013-02-02 MED ORDER — OMEPRAZOLE 40 MG PO CPDR: 40.0000 mg | DELAYED_RELEASE_CAPSULE | Freq: Every day | ORAL | Status: DC

## 2013-02-02 NOTE — Progress Notes (Signed)
  Subjective:    Patient ID: David Jensen, male    DOB: Mar 28, 1961, 52 y.o.   MRN: [id number]  HPI He is here for complete examination. He does have a lesion present on the right temporal area that he would like evaluated. His wife is being treated for hepatitis C and he wants to discuss immunizations. His reflux is under good control using the Prilosec on an as-needed basis. He continues on Lipitor. He does have a family history of heart disease. His father died in his early 87s. His work and home life are going fairly well. Family and social history was reviewed.   Review of Systems  Constitutional: Negative.   HENT: Negative.   Eyes: Negative.   Respiratory: Negative.   Cardiovascular: Negative.   Gastrointestinal: Negative.   Endocrine: Negative.   Genitourinary: Negative.   Musculoskeletal: Negative.   Allergic/Immunologic: Negative.   Neurological: Negative.   Hematological: Negative.   Psychiatric/Behavioral: Negative.        Objective:   Physical Exam BP 128/88  Pulse 106  Ht 6' 2.5" (1.892 m)  Wt 202 lb (91.627 kg)  BMI 25.60 kg/m2  SpO2 99%  General Appearance:    Alert, cooperative, no distress, appears stated age  Head:    Normocephalic, without obvious abnormality, atraumatic  Eyes:    PERRL, conjunctiva/corneas clear, EOM's intact, fundi    benign  Ears:    Normal TM's and external ear canals  Nose:   Nares normal, mucosa normal, no drainage or sinus   tenderness  Throat:   Lips, mucosa, and tongue normal; teeth and gums normal  Neck:   Supple, no lymphadenopathy;  thyroid:  no   enlargement/tenderness/nodules; no carotid   bruit or JVD  Back:    Spine nontender, no curvature, ROM normal, no CVA     tenderness  Lungs:     Clear to auscultation bilaterally without wheezes, rales or     ronchi; respirations unlabored  Chest Wall:    No tenderness or deformity   Heart:    Regular rate and rhythm, S1 and S2 normal, no murmur, rub   or gallop  Breast Exam:     No chest wall tenderness, masses or gynecomastia  Abdomen:     Soft, non-tender, nondistended, normoactive bowel sounds,    no masses, no hepatosplenomegaly        Extremities:   No clubbing, cyanosis or edema  Pulses:   2+ and symmetric all extremities  Skin:   Skin color, texture, turgor normal, no rashes or lesions  Lymph nodes:   Cervical, supraclavicular, and axillary nodes normal  Neurologic:   CNII-XII intact, normal strength, sensation and gait; reflexes 2+ and symmetric throughout          Psych:   Normal mood, affect, hygiene and grooming.          Assessment & Plan:  Routine general medical examination at a health care facility - Plan: CBC with Differential, Comprehensive metabolic panel, Lipid panel  Seborrheic keratosis  Need for prophylactic vaccination and inoculation against influenza - Plan: Flu Vaccine QUAD 36+ mos IM  GERD (gastroesophageal reflux disease) - Plan: omeprazole (PRILOSEC) 40 MG capsule  ADHD (attention deficit hyperactivity disorder) - Plan: methylphenidate (RITALIN LA) 30 MG 24 hr capsule  Hyperlipidemia LDL goal <100 - Plan: Lipid panel  Family history of heart disease in male family member before age 20 - Plan: Lipid panel  Dyslipidemia - Plan: atorvastatin (LIPITOR) 20 MG tablet

## 2013-02-03 NOTE — Progress Notes (Signed)
Quick Note:  The blood work is normal ______ 

## 2013-02-07 ENCOUNTER — Telehealth: Payer: Self-pay | Admitting: Family Medicine

## 2013-02-09 NOTE — Telephone Encounter (Signed)
lm

## 2013-04-18 ENCOUNTER — Telehealth: Payer: Self-pay | Admitting: Family Medicine

## 2013-04-18 DIAGNOSIS — E785 Hyperlipidemia, unspecified: Secondary | ICD-10-CM

## 2013-04-18 MED ORDER — ATORVASTATIN CALCIUM 20 MG PO TABS: 20.0000 mg | ORAL_TABLET | Freq: Every day | ORAL | Status: DC

## 2013-04-18 NOTE — Telephone Encounter (Signed)
Rx sent to pharm

## 2013-07-25 ENCOUNTER — Telehealth: Payer: Self-pay

## 2013-07-25 DIAGNOSIS — E785 Hyperlipidemia, unspecified: Secondary | ICD-10-CM

## 2013-07-25 MED ORDER — ATORVASTATIN CALCIUM 20 MG PO TABS: 20.0000 mg | ORAL_TABLET | Freq: Every day | ORAL | Status: DC

## 2013-07-25 NOTE — Telephone Encounter (Signed)
Pt.notified

## 2013-07-25 NOTE — Telephone Encounter (Signed)
Pt is requesting a years supply worth of lipitor so that he will not have to come in the office as often/get refills as often. Please advise

## 2013-07-25 NOTE — Telephone Encounter (Signed)
Let him know that I called in a 90 day supply with a couple of refills which should get him to later on in the fall when we will need to repeat his blood work

## 2013-07-25 NOTE — Telephone Encounter (Signed)
error 

## 2013-11-24 ENCOUNTER — Ambulatory Visit
Admission: RE | Admit: 2013-11-24 | Discharge: 2013-11-24 | Disposition: A | Discharge status: Home / Self Care | Payer: BC Managed Care – PPO | Source: Ambulatory Visit | Attending: Medical | Admitting: Medical

## 2013-11-24 ENCOUNTER — Encounter: Payer: Self-pay | Admitting: Medical

## 2013-11-24 ENCOUNTER — Ambulatory Visit (INDEPENDENT_AMBULATORY_CARE_PROVIDER_SITE_OTHER): Payer: BC Managed Care – PPO | Admitting: Medical

## 2013-11-24 VITALS — BP 122/80 | HR 105 | Temp 98.2°F | Resp 16 | Wt 211.0 lb

## 2013-11-24 DIAGNOSIS — F17200 Nicotine dependence, unspecified, uncomplicated: Secondary | ICD-10-CM

## 2013-11-24 DIAGNOSIS — R059 Cough, unspecified: Secondary | ICD-10-CM

## 2013-11-24 DIAGNOSIS — R000 Tachycardia, unspecified: Secondary | ICD-10-CM

## 2013-11-24 DIAGNOSIS — R0789 Other chest pain: Secondary | ICD-10-CM

## 2013-11-24 DIAGNOSIS — Z113 Encounter for screening for infections with a predominantly sexual mode of transmission: Secondary | ICD-10-CM

## 2013-11-24 DIAGNOSIS — Z205 Contact with and (suspected) exposure to viral hepatitis: Secondary | ICD-10-CM

## 2013-11-24 DIAGNOSIS — Z20828 Contact with and (suspected) exposure to other viral communicable diseases: Secondary | ICD-10-CM

## 2013-11-24 DIAGNOSIS — R05 Cough: Secondary | ICD-10-CM

## 2013-11-24 DIAGNOSIS — Z23 Encounter for immunization: Secondary | ICD-10-CM

## 2013-11-24 DIAGNOSIS — F4321 Adjustment disorder with depressed mood: Secondary | ICD-10-CM

## 2013-11-24 DIAGNOSIS — R030 Elevated blood-pressure reading, without diagnosis of hypertension: Secondary | ICD-10-CM

## 2013-11-24 LAB — COMPREHENSIVE METABOLIC PANEL
ALBUMIN: 4.3 g/dL (ref 3.5–5.2)
ALK PHOS: 54 U/L (ref 39–117)
ALT: 16 U/L (ref 0–53)
AST: 20 U/L (ref 0–37)
BUN: 14 mg/dL (ref 6–23)
CHLORIDE: 103 meq/L (ref 96–112)
CO2: 27 mEq/L (ref 19–32)
Calcium: 9.5 mg/dL (ref 8.4–10.5)
Creat: 0.95 mg/dL (ref 0.50–1.35)
GLUCOSE: 92 mg/dL (ref 70–99)
POTASSIUM: 4.4 meq/L (ref 3.5–5.3)
Sodium: 139 mEq/L (ref 135–145)
TOTAL PROTEIN: 7 g/dL (ref 6.0–8.3)
Total Bilirubin: 0.7 mg/dL (ref 0.2–1.2)

## 2013-11-24 LAB — CBC
HCT: 42.2 % (ref 39.0–52.0)
Hemoglobin: 15 g/dL (ref 13.0–17.0)
MCH: 34.8 pg — AB (ref 26.0–34.0)
MCHC: 35.5 g/dL (ref 30.0–36.0)
MCV: 97.9 fL (ref 78.0–100.0)
Platelets: 178 10*3/uL (ref 150–400)
RBC: 4.31 MIL/uL (ref 4.22–5.81)
RDW: 13 % (ref 11.5–15.5)
WBC: 6.7 10*3/uL (ref 4.0–10.5)

## 2013-11-24 NOTE — Progress Notes (Signed)
Subjective:   David Jensen is a 53 y.o. male presenting on 11/24/2013 with Hypertension, Cough and Anxiety  Here for several concerns  Unfortunately his wife passed away Dec 01, 2013.  He states that his son found her dead on the floor, believed to have died from a heart attack.  Since then both his son and daughter want him to come in and get checked out.  Wife was 58yo.  He is still dealing with grief, having some anxiety, sleep problems but doesn't want to use medication for this.    He may be overreacting, but wants to come in to check some things out.  He had some mild chest pressure yesterday, checked BP and it was 153/105.  Doesn't normally check his BP so no idea what his BP has been running.  He reports brief occasional chest pressure a few times in last few weeks, but no associated SOB, dyspnea, palpitations, edema, dizziness, syncope.  Has had cough intermittent the last month, but no other respiratory symptoms.  He wants chest xray to make sure nothing is going on.   Denies fever ,sweats, weight loss.   Prior cardiac eval including seeing Dr. Antoine Poche a few years ago, had some testing.  Was suppose to get cardiac scoring test, but never did this since insurance wouldn't pay.    He notes wife had hx/o Hep C.  He wants screening.  He to his knowledge has never tested +.    He wants a Flu shot.  No other complaint.  Review of Systems ROS as in subjective      Objective:    Filed Vitals:   11/24/13 1452  BP: 122/80  Pulse: 105  Temp: 98.2 F (36.8 C)  Resp: 16    General appearance: alert, no distress, WD/WN, white male HEENT: normocephalic, sclerae anicteric, TMs pearly, nares patent, no discharge or erythema, pharynx normal Oral cavity: MMM, no lesions Neck: supple, no lymphadenopathy, no thyromegaly, no masses, no JVD Heart: tachycardic about 108, otherwise RRR, normal S1, S2, no murmurs Lungs: CTA bilaterally, no wheezes, rhonchi, or rales Abdomen: +bs,  soft, non tender, non distended, no masses, no hepatomegaly, no splenomegaly, no bruits Pulses: 2+ symmetric, upper and lower extremities, normal cap refill Neuro: CN2-12 intact, nonfocal exam Psych: pleasant, behavior appropriate Ext: no edema   Adult ECG Report  Indication: chest discomfort  Rate: 89 bpm  Rhythm: normal sinus rhythm  QRS Axis: 46 degrees  PR Interval:  QRS Duration: 96ms  QTc:  Conduction Disturbances: none  Other Abnormalities: nonspecific findings  Patient's cardiac risk factors are: dyslipidemia, family history of premature cardiovascular disease, male gender and smoking/ tobacco exposure.  EKG comparison: 02/2011   Narrative Interpretation: no obvious acute change, but abnormal EKG     Assessment: Encounter Diagnoses  Name Primary?  . Elevated blood pressure reading without diagnosis of hypertension Yes  . Chest discomfort   . Tachycardia   . Grieving   . Smoker   . Cough   . Exposure to hepatitis C   . Screen for STD (sexually transmitted disease)   . Need for prophylactic vaccination and inoculation against influenza      Plan: Elevated Blood pressure without diagnosis of hypertension-currently his blood pressures normal, and looking back in the chart several visits in the last year has had normal blood pressure. Advise he keep a blood pressure diary, and continue to monitor this. Avoid added salt, advise healthy diet  Chest discomfort-EKG and chest x-ray and  labs today. Discussed symptoms that would prompt a call to 911  Tachycardia-looking back through several years worth of visit he continues to maintain higher than normal heart rate.  Advise he limit caffeine, discussed the need to lose weight and exercise regularly, eat healthy.  Labs today.  Consider beta blocker.    Grieving - expressed my sympathies  Smoker, cough - discussed risks of smoking, need to quit.   Will send for CXR.  Exposure to Hep C, STD screening - labs  today  Counseled on the influenza virus vaccine.  Vaccine information sheet given.  Influenza vaccine given after consent obtained.   David Jensen was seen today for hypertension, cough and anxiety.  Diagnoses and associated orders for this visit:  Elevated blood pressure reading without diagnosis of hypertension  Chest discomfort - Comprehensive metabolic panel - TSH - CBC - T4, free - High sensitivity CRP - EKG 12-Lead - PR ELECTROCARDIOGRAM, COMPLETE - DG Chest 2 View; Future - Hepatitis C antibody - Hepatitis B surface antigen - Hepatitis B surface antibody - Hepatitis B core antibody, IgM - HIV antibody - RPR  Tachycardia - Comprehensive metabolic panel - TSH - CBC - T4, free - High sensitivity CRP - EKG 12-Lead - PR ELECTROCARDIOGRAM, COMPLETE - DG Chest 2 View; Future - Hepatitis C antibody - Hepatitis B surface antigen - Hepatitis B surface antibody - Hepatitis B core antibody, IgM - HIV antibody - RPR  Grieving  Smoker - Comprehensive metabolic panel - TSH - CBC - T4, free - High sensitivity CRP - EKG 12-Lead - PR ELECTROCARDIOGRAM, COMPLETE - DG Chest 2 View; Future - Hepatitis C antibody - Hepatitis B surface antigen - Hepatitis B surface antibody - Hepatitis B core antibody, IgM - HIV antibody - RPR  Cough  Exposure to hepatitis C - Hepatitis C antibody - Hepatitis B surface antigen - Hepatitis B surface antibody - Hepatitis B core antibody, IgM - HIV antibody - RPR  Screen for STD (sexually transmitted disease) - Hepatitis C antibody - Hepatitis B surface antigen - Hepatitis B surface antibody - Hepatitis B core antibody, IgM - HIV antibody - RPR  Need for prophylactic vaccination and inoculation against influenza - Flu Vaccine QUAD 36+ mos IM    Return pending studies.

## 2013-11-25 LAB — HEPATITIS C ANTIBODY: HCV AB: NEGATIVE

## 2013-11-25 LAB — HIV ANTIBODY (ROUTINE TESTING W REFLEX): HIV: NONREACTIVE

## 2013-11-25 LAB — RPR

## 2013-11-25 LAB — TSH: TSH: 1.911 u[IU]/mL (ref 0.350–4.500)

## 2013-11-25 LAB — HIGH SENSITIVITY CRP: CRP, High Sensitivity: 1.7 mg/L

## 2013-11-25 LAB — HEPATITIS B SURFACE ANTIBODY, QUANTITATIVE: Hepatitis B-Post: 0 m[IU]/mL

## 2013-11-25 LAB — HEPATITIS B SURFACE ANTIGEN: Hepatitis B Surface Ag: NEGATIVE

## 2013-11-25 LAB — HEPATITIS B CORE ANTIBODY, IGM: HEP B C IGM: NONREACTIVE

## 2013-11-25 LAB — T4, FREE: Free T4: 1.04 ng/dL (ref 0.80–1.80)

## 2013-11-27 ENCOUNTER — Other Ambulatory Visit: Payer: Self-pay | Admitting: Medical

## 2013-11-27 MED ORDER — METOPROLOL TARTRATE 25 MG PO TABS: 25.0000 mg | ORAL_TABLET | Freq: Two times a day (BID) | ORAL | Status: DC

## 2014-01-11 ENCOUNTER — Ambulatory Visit (INDEPENDENT_AMBULATORY_CARE_PROVIDER_SITE_OTHER): Payer: BC Managed Care – PPO | Admitting: Medical

## 2014-01-11 ENCOUNTER — Encounter: Payer: Self-pay | Admitting: Medical

## 2014-01-11 VITALS — BP 142/90 | HR 100 | Temp 98.2°F | Resp 16 | Wt 208.0 lb

## 2014-01-11 DIAGNOSIS — R000 Tachycardia, unspecified: Secondary | ICD-10-CM

## 2014-01-11 DIAGNOSIS — R053 Chronic cough: Secondary | ICD-10-CM

## 2014-01-11 DIAGNOSIS — Z720 Tobacco use: Secondary | ICD-10-CM

## 2014-01-11 DIAGNOSIS — Z23 Encounter for immunization: Secondary | ICD-10-CM

## 2014-01-11 DIAGNOSIS — F17200 Nicotine dependence, unspecified, uncomplicated: Secondary | ICD-10-CM

## 2014-01-11 DIAGNOSIS — R05 Cough: Secondary | ICD-10-CM

## 2014-01-11 DIAGNOSIS — R030 Elevated blood-pressure reading, without diagnosis of hypertension: Secondary | ICD-10-CM

## 2014-01-11 MED ORDER — BUPROPION HCL ER (XL) 150 MG PO TB24: 150.0000 mg | ORAL_TABLET | Freq: Every day | ORAL | Status: DC

## 2014-01-11 MED ORDER — NICOTINE 21 MG/24HR TD PT24: 21.0000 mg | MEDICATED_PATCH | Freq: Every day | TRANSDERMAL | Status: DC

## 2014-01-11 MED ORDER — METOPROLOL TARTRATE 50 MG PO TABS: 50.0000 mg | ORAL_TABLET | Freq: Two times a day (BID) | ORAL | Status: DC

## 2014-01-11 NOTE — Addendum Note (Signed)
Addended by: Lilli LightLOMAX, Elaysia Devargas G on: 01/11/2014 02:34 PM   Modules accepted: Orders

## 2014-01-11 NOTE — Progress Notes (Signed)
Subjective: Here for f/u.  I saw him for multiple concerns in August. At last visit we discussed tobacco use, cough, grieving, elevated BP, labs screening.  Still getting shakes, mostly in the morning.  Wonders if its his nerves.  Still smoking. Has tried nicotine gum without improvement.  Still has some cough.   Smokes pipes x 30 years, cigarettes prior to this.  He does note endoscopy few years ago, and was normal.  Was done by [facility] Rod[facility]Lenny [facility].  No weight loss, hoarseness, fever, night sweats.  He does get mucous coming up at times.  Takes GERD medication occasionally.  Taking metoprolol 25mg  BID since last visit.   Rarely takes focus stimulant, and has only taken once since last visit.  Wants medication to help tobacco.   Hasn't seen counseling since last visit.  No other aggravating or relieving factors.  No other c/o.  Objective: BP 142/90  Pulse 100  Temp(Src) 98.2 F (36.8 C) (Oral)  Resp 16  Wt 208 lb (94.348 kg)  BP Readings from Last 3 Encounters:  01/11/14 142/90  11/24/13 122/80  02/02/13 128/88   Filed Vitals:   01/11/14 1153  BP: 142/90  Pulse: 100  Temp: 98.2 F (36.8 C)  Resp: 16    General appearance: alert, no distress, WD/WN HEENT: normocephalic, sclerae anicteric, TMs pearly, nares patent, no discharge or erythema, pharynx normal Oral cavity: MMM, no lesions Neck: supple, no lymphadenopathy, no thyromegaly, no masses Heart: RRR, normal S1, S2, no murmurs Lungs: CTA bilaterally, no wheezes, rhonchi, or rales Pulses: 2+ symmetric, upper and lower extremities, normal cap refill     Assessment: Encounter Diagnoses  Name Primary?  . Tachycardia Yes  . Chronic cough   . Tobacco use disorder   . Need for hepatitis B vaccination      Plan: PHQ-9 with score of 19.  Given his unexpected death of his wife, grieving, mood, begin Wellubtrin.  Tachycardia - increase to Metoprolol 50mg  BID  Chronci cough - use trial of 30 day omeprazole in the  morning, OTC antihistamine every night time and f/u in 1 month.   If cough still present at that time, or if not much improved, consider ENT vs chest CT.  Tobacco use - discussed treatment, risks/benefits of medication, begin Wellbutrin 150mg  XL daily, nicotine patch, and recommended counseling  Counseled on the Hepatitis B virus vaccine.  Vaccine information sheet given.  Hepatitis B vaccine given after consent obtained.  Patient was advised to return in 2 months for Hep B #2, and in 6 months for Hep B #3.

## 2014-02-09 ENCOUNTER — Ambulatory Visit (INDEPENDENT_AMBULATORY_CARE_PROVIDER_SITE_OTHER): Payer: BC Managed Care – PPO | Admitting: Medical

## 2014-02-09 ENCOUNTER — Encounter: Payer: Self-pay | Admitting: Medical

## 2014-02-09 VITALS — BP 112/70 | HR 73 | Temp 98.1°F | Resp 16 | Wt 203.0 lb

## 2014-02-09 DIAGNOSIS — R000 Tachycardia, unspecified: Secondary | ICD-10-CM

## 2014-02-09 DIAGNOSIS — R05 Cough: Secondary | ICD-10-CM

## 2014-02-09 DIAGNOSIS — R030 Elevated blood-pressure reading, without diagnosis of hypertension: Secondary | ICD-10-CM

## 2014-02-09 DIAGNOSIS — Z720 Tobacco use: Secondary | ICD-10-CM

## 2014-02-09 DIAGNOSIS — F17200 Nicotine dependence, unspecified, uncomplicated: Secondary | ICD-10-CM

## 2014-02-09 DIAGNOSIS — M7702 Medial epicondylitis, left elbow: Secondary | ICD-10-CM

## 2014-02-09 DIAGNOSIS — E785 Hyperlipidemia, unspecified: Secondary | ICD-10-CM

## 2014-02-09 DIAGNOSIS — R053 Chronic cough: Secondary | ICD-10-CM

## 2014-02-09 MED ORDER — BUPROPION HCL ER (XL) 300 MG PO TB24: 300.0000 mg | ORAL_TABLET | Freq: Every day | ORAL | Status: DC

## 2014-02-09 NOTE — Patient Instructions (Signed)
Thank you for giving me the opportunity to serve you today.    Your diagnosis today includes: Encounter Diagnoses  Name Primary?  . Tachycardia Yes  . Hyperlipidemia   . Chronic cough   . Tobacco use disorder   . Elevated blood pressure reading without diagnosis of hypertension   . Medial epicondylitis of elbow, left      Specific recommendations today include:  Continue Metoprolol 50mg  twice daily  Increase to Wellbutrin to 150mg , 2 tablets daily.  When this runs out change to 300mg  daily  Continue reflux medication several more weeks then try stopping to see if cough stays resolved  Medial epicondylitis - for the next week or 2, use ice to the inside of the left elbow, OTC Aleve, resting the arm, and consider arm sling if needed  If not improving in 1-2 weeks, let me know  Follow up pending labs.   I have included other useful information below for your review.  Medial Epicondylitis (Golfer's Elbow) with Rehab Medial epicondylitis involves inflammation and pain around the inner (medial) portion of the elbow. This pain is caused by inflammation of the tendons in the forearm that flex (bring down) the wrist. Medial epicondylitis is also called golfer's elbow, because it is common among golfers. However, it may occur in any individual who flexes the wrist regularly. If medial epicondylitis is left untreated, it may become a chronic problem. SYMPTOMS   Pain, tenderness, or inflammation over the inner (medial) side of the elbow.  Pain or weakness with gripping activities.  Pain that increases with wrist twisting motions (using a screwdriver, playing golf, bowling). CAUSES  Medial epicondylitis is caused by inflammation of the tendons that flex the wrist. Causes of injury may include:  Chronic, repetitive stress and strain to the tendons that run from the wrist and forearm to the elbow.  Sudden strain on the forearm, including wrist snap when serving balls with racquet  sports, or throwing a baseball. RISK INCREASES WITH:  Sports or occupations that require repetitive and/or strenuous forearm and wrist movements (pitching a baseball, golfing, carpentry).  Poor wrist and forearm strength and flexibility.  Failure to warm up properly before activity.  Resuming activity before healing, rehabilitation, and conditioning are complete. PREVENTION   Warm up and stretch properly before activity.  Maintain physical fitness:  Strength, flexibility, and endurance.  Cardiovascular fitness.  Wear and use properly fitted equipment.  Learn and use proper technique and have a coach correct improper technique.  Wear a tennis elbow (counter force) brace. PROGNOSIS  The course of this condition depends on the degree of the injury. If treated properly, acute cases (symptoms lasting less than 4 weeks) are often resolved in 2 to 6 weeks. Chronic (longer lasting cases) often resolve in 3 to 6 months, but may require physical therapy. RELATED COMPLICATIONS   Frequently recurring symptoms, resulting in a chronic problem. Properly treating the problem the first time decreases frequency of recurrence.  Chronic inflammation, scarring, and partial tendon tear, requiring surgery.  Delayed healing or resolution of symptoms. TREATMENT  Treatment first involves the use of ice and medicine, to reduce pain and inflammation. Strengthening and stretching exercises may reduce discomfort, if performed regularly. These exercises may be performed at home, if the condition is an acute injury. Chronic cases may require a referral to a physical therapist for evaluation and treatment. Your caregiver may advise a corticosteroid injection to help reduce inflammation. Rarely, surgery is needed. MEDICATION  If pain medicine is needed, nonsteroidal  anti-inflammatory medicines (aspirin and ibuprofen), or other minor pain relievers (acetaminophen), are often advised.  Do not take pain medicine  for 7 days before surgery.  Prescription pain relievers may be given, if your caregiver thinks they are needed. Use only as directed and only as much as you need.  Corticosteroid injections may be recommended. These injections should be reserved only for the most severe cases, because they can only be given a certain number of times. HEAT AND COLD  Cold treatment (icing) should be applied for 10 to 15 minutes every 2 to 3 hours for inflammation and pain, and immediately after activity that aggravates your symptoms. Use ice packs or an ice massage.  Heat treatment may be used before performing stretching and strengthening activities prescribed by your caregiver, physical therapist, or athletic trainer. Use a heat pack or a warm water soak. SEEK MEDICAL CARE IF: Symptoms get worse or do not improve in 2 weeks, despite treatment. EXERCISES  RANGE OF MOTION (ROM) AND STRETCHING EXERCISES - Epicondylitis, Medial (Golfer's Elbow) These exercises may help you when beginning to rehabilitate your injury. Your symptoms may go away with or without further involvement from your physician, physical therapist or athletic trainer. While completing these exercises, remember:   Restoring tissue flexibility helps normal motion to return to the joints. This allows healthier, less painful movement and activity.  An effective stretch should be held for at least 30 seconds.  A stretch should never be painful. You should only feel a gentle lengthening or release in the stretched tissue. RANGE OF MOTION - Wrist Flexion, Active-Assisted  Extend your right / left elbow with your fingers pointing down.*  Gently pull the back of your hand towards you, until you feel a gentle stretch on the top of your forearm.  Hold this position for __________ seconds. Repeat __________ times. Complete this exercise __________ times per day.  *If directed by your physician, physical therapist or athletic trainer, complete this  stretch with your elbow bent, rather than extended. RANGE OF MOTION - Wrist Extension, Active-Assisted  Extend your right / left elbow and turn your palm upwards.*  Gently pull your palm and fingertips back, so your wrist extends and your fingers point more toward the ground.  You should feel a gentle stretch on the inside of your forearm.  Hold this position for __________ seconds. Repeat __________ times. Complete this exercise __________ times per day. *If directed by your physician, physical therapist or athletic trainer, complete this stretch with your elbow bent, rather than extended. STRETCH - Wrist Extension   Place your right / left fingertips on a tabletop leaving your elbow slightly bent. Your fingers should point backwards.  Gently press your fingers and palm down onto the table, by straightening your elbow. You should feel a stretch on the inside of your forearm.  Hold this position for __________ seconds. Repeat __________ times. Complete this stretch __________ times per day.  STRENGTHENING EXERCISES - Epicondylitis, Medial (Golfer's Elbow) These exercises may help you when beginning to rehabilitate your injury. They may resolve your symptoms with or without further involvement from your physician, physical therapist or athletic trainer. While completing these exercises, remember:   Muscles can gain both the endurance and the strength needed for everyday activities through controlled exercises.  Complete these exercises as instructed by your physician, physical therapist or athletic trainer. Increase the resistance and repetitions only as guided.  You may experience muscle soreness or fatigue, but the pain or discomfort you are trying  to eliminate should never worsen during these exercises. If this pain does get worse, stop and make sure you are following the directions exactly. If the pain is still present after adjustments, discontinue the exercise until you can discuss  the trouble with your caregiver. STRENGTH - Wrist Flexors  Sit with your right / left forearm palm-up, and fully supported on a table or countertop. Your elbow should be resting below the height of your shoulder. Allow your wrist to extend over the edge of the surface.  Loosely holding a __________ weight, or a piece of rubber exercise band or tubing, slowly curl your hand up toward your forearm.  Hold this position for __________ seconds. Slowly lower the wrist back to the starting position in a controlled manner. Repeat __________ times. Complete this exercise __________ times per day.  STRENGTH - Wrist Extensors  Sit with your right / left forearm palm-down and fully supported. Your elbow should be resting below the height of your shoulder. Allow your wrist to extend over the edge of the surface.  Loosely holding a __________ weight, or a piece of rubber exercise band or tubing, slowly curl your hand up toward your forearm.  Hold this position for __________ seconds. Slowly lower the wrist back to the starting position in a controlled manner. Repeat __________ times. Complete this exercise __________ times per day.  STRENGTH - Ulnar Deviators  Stand with a ____________________ weight in your right / left hand, or sit while holding a rubber exercise band or tubing, with your healthy arm supported on a table or countertop.  Move your wrist so that your pinkie travels toward your forearm and your thumb moves away from your forearm.  Hold this position for __________ seconds and then slowly lower the wrist back to the starting position. Repeat __________ times. Complete this exercise __________ times per day STRENGTH - Grip   Grasp a tennis ball, a dense sponge, or a large, rolled sock in your hand.  Squeeze as hard as you can, without increasing any pain.  Hold this position for __________ seconds. Release your grip slowly. Repeat __________ times. Complete this exercise __________  times per day.  STRENGTH - Forearm Supinators   Sit with your right / left forearm supported on a table, keeping your elbow below shoulder height. Rest your hand over the edge, palm down.  Gently grip a hammer or a soup ladle.  Without moving your elbow, slowly turn your palm and hand upward to a "thumbs-up" position.  Hold this position for __________ seconds. Slowly return to the starting position. Repeat __________ times. Complete this exercise __________ times per day.  STRENGTH - Forearm Pronators  Sit with your right / left forearm supported on a table, keeping your elbow below shoulder height. Rest your hand over the edge, palm up.  Gently grip a hammer or a soup ladle.  Without moving your elbow, slowly turn your palm and hand upward to a "thumbs-up" position.  Hold this position for __________ seconds. Slowly return to the starting position. Repeat __________ times. Complete this exercise __________ times per day.  Document Released: 03/16/2005 Document Revised: 06/08/2011 Document Reviewed: 06/28/2008 [facility] Patient Information 2015 [employer] [place]. This information is not intended to replace advice given to you by your health care provider. Make sure you discuss any questions you have with your health care provider.   YOU CAN QUIT SMOKING!  Talk to your medical provider about using medicines to help you quit. These include nicotine replacement gum, lozenges,  or skin patches.  Consider calling 1-800-QUIT-NOW, a toll free 24/7 hotline with free counseling to help you quit.  If you are ready to quit smoking or are thinking about it, congratulations! You have chosen to help yourself be healthier and live longer! There are lots of different ways to quit smoking. Nicotine gum, nicotine patches, a nicotine inhaler, or nicotine nasal spray can help with physical craving. Hypnosis, support groups, and medicines help break the habit of smoking. TIPS TO GET OFF AND STAY OFF  CIGARETTES  Learn to predict your moods. Do not let a bad situation be your excuse to have a cigarette. Some situations in your life might tempt you to have a cigarette.   Ask friends and co-workers not to smoke around you.   Make your home smoke-free.   Never have "just one" cigarette. It leads to wanting another and another. Remind yourself of your decision to quit.   On a card, make a list of your reasons for not smoking. Read it at least the same number of times a day as you have a cigarette. Tell yourself everyday, "I do not want to smoke. I choose not to smoke."   Ask someone at home or work to help you with your plan to quit smoking.   Have something planned after you eat or have a cup of coffee. Take a walk or get other exercise to perk you up. This will help to keep you from overeating.   Try a relaxation exercise to calm you down and decrease your stress. Remember, you may be tense and nervous the first two weeks after you quit. This will pass.   Find new activities to keep your hands busy. Play with a pen, coin, or rubber band. Doodle or draw things on paper.   Brush your teeth right after eating. This will help cut down the craving for the taste of tobacco after meals. You can try mouthwash too.   Try gum, breath mints, or diet candy to keep something in your mouth.  IF YOU SMOKE AND WANT TO QUIT:  Do not stock up on cigarettes. Never buy a carton. Wait until one pack is finished before you buy another.   Never carry cigarettes with you at work or at home.   Keep cigarettes as far away from you as possible. Leave them with someone else.   Never carry matches or a lighter with you.   Ask yourself, "Do I need this cigarette or is this just a reflex?"   Bet with someone that you can quit. Put cigarette money in a piggy bank every morning. If you smoke, you give up the money. If you do not smoke, by the end of the week, you keep the money.   Keep trying. It takes 21 days  to change a habit!  Document Released: 01/10/2009 Document Revised: 11/26/2010 Document Reviewed: 01/10/2009 [facility] Patient Information 2012 [place], [place].   Counseling services   [facility] [phone] office [email or web address] [address], [address] [place], [place], [place] [address]  Gale Journey, therapist  Franchot Erichsen, MA, clinical psychologist  Cognitive-Behavior Therapy; Mood Disorders; Anxiety Disorders; adult and child ADHD; Family Therapy; Stress Management; personal growth, and Marital Therapy.    Carlus Pavlov Ph.D., clinical psychologist Cognitive-Behavior Therapy; Mood Disorders; Anxiety Disorders; Stress     Management   [facility] [address] [place], [place] [address] [phone]   [facility] Sheran Luz, psychotherapist [address] [place]  [place], [place] [address] [phone]   Miguel Aschoff Ph.D., clinical psychologist [phone] office [address] [place], [place] [address] Cognitive Behavior Therapy, Depression, Bipolar, Anxiety, Grief and Loss

## 2014-02-09 NOTE — Progress Notes (Signed)
Subjective: Here for med check  tachycardia-since last visit taking metoprolol higher dose 50 mg twice daily.BP runs 135/90   at his last visit we did discuss chronic cough-taking omeprazole daily,but he stopped taking the allergy medication. So for cough is decreasing  Smoking-Is taking Wellbutrin for depression and smoking cessation, not seeing improvements in tobacco urges. Wants to try higher dose. Only taking 150 mg daily.  Using nicotine patch 3 times weekly on average not daily.  Hasn't done counseling  Hasn't taken his ADHD medication in months  Compliant with Lipitor daily  He has been exercising, lifting weights for Exercise, feels like he pulled a muscle in his bicep, right side.  Right handed.  No other complaint  ROS as in subjective    Objective: BP 112/70 mmHg  Pulse 73  Temp(Src) 98.1 F (36.7 C) (Oral)  Resp 16  Wt 203 lb (92.08 kg)  Wt Readings from Last 3 Encounters:  02/09/14 203 lb (92.08 kg)  01/11/14 208 lb (94.348 kg)  11/24/13 211 lb (95.709 kg)    General appearance: alert, no distress, WD/WN Neck: supple, no lymphadenopathy, no thyromegaly, no masses Heart: RRR, normal S1, S2, no murmurs Lungs: CTA bilaterally, no wheezes, rhonchi, or rales Abdomen: +bs, soft, non tender, non distended, no masses, no hepatomegaly, no splenomegaly Pulses: 2+ symmetric, upper and lower extremities, normal cap refill Ext: no edema MSK: tender over left medial epicondyle, otherwise arm nonteender, no deformity, normal ROM    Assessment: Encounter Diagnoses  Name Primary?  . Tachycardia Yes  . Hyperlipidemia   . Chronic cough   . Tobacco use disorder   . Elevated blood pressure reading without diagnosis of hypertension   . Medial epicondylitis of elbow, left      Plan: tachycardia-much improved on the higher dose of beta blocker, continue Metoprolol 50 twice daily  hyperlipidemia-continue current medications, lab today  Chronic cough-actually  improving on reflux medication, continue this for another to 3 weeks then stop. We'll then await the see if the cough is gone or continues  Tobacco use disorder-increase to Wellbutrin 300mg  XL daily  Elevated BP - improved today  Medial epicondylitis - advised ice, rest, ALeve OTC, and if not improving in 2 wk, let me know  Consider counseling given relatively recent death of his wife.   Resources printed for him.

## 2014-02-10 LAB — LIPID PANEL
Cholesterol: 147 mg/dL (ref 0–200)
HDL: 60 mg/dL (ref >39)
LDL CALC: 73 mg/dL (ref 0–99)
Total CHOL/HDL Ratio: 2.5 Ratio
Triglycerides: 68 mg/dL (ref <150)
VLDL: 14 mg/dL (ref 0–40)

## 2014-04-11 ENCOUNTER — Other Ambulatory Visit (INDEPENDENT_AMBULATORY_CARE_PROVIDER_SITE_OTHER): Payer: BC Managed Care – PPO

## 2014-04-11 DIAGNOSIS — Z23 Encounter for immunization: Secondary | ICD-10-CM

## 2014-04-27 ENCOUNTER — Telehealth: Payer: Self-pay | Admitting: Internal Medicine

## 2014-04-27 MED ORDER — BUPROPION HCL ER (XL) 300 MG PO TB24: 300.0000 mg | ORAL_TABLET | Freq: Every day | ORAL | Status: DC

## 2014-04-27 NOTE — Telephone Encounter (Signed)
[facility] XL 300mg  [facility]

## 2014-04-27 NOTE — Telephone Encounter (Signed)
done

## 2014-05-14 ENCOUNTER — Other Ambulatory Visit: Payer: Self-pay

## 2014-05-14 ENCOUNTER — Telehealth: Payer: Self-pay | Admitting: Internal Medicine

## 2014-05-14 DIAGNOSIS — E785 Hyperlipidemia, unspecified: Secondary | ICD-10-CM

## 2014-05-14 MED ORDER — ATORVASTATIN CALCIUM 20 MG PO TABS: 20.0000 mg | ORAL_TABLET | Freq: Every day | ORAL | Status: DC

## 2014-05-14 MED ORDER — METOPROLOL TARTRATE 50 MG PO TABS: 50.0000 mg | ORAL_TABLET | Freq: Two times a day (BID) | ORAL | Status: DC

## 2014-05-14 NOTE — Telephone Encounter (Signed)
done

## 2014-05-14 NOTE — Telephone Encounter (Signed)
Refill request for atorvastatin 20mg  #90 ,[facility]oprolol 50mg  #60 [facility]

## 2014-06-15 ENCOUNTER — Other Ambulatory Visit: Payer: Self-pay | Admitting: Family Medicine

## 2014-06-15 ENCOUNTER — Telehealth: Payer: Self-pay | Admitting: Internal Medicine

## 2014-06-15 MED ORDER — METOPROLOL TARTRATE 50 MG PO TABS: 50.0000 mg | ORAL_TABLET | Freq: Two times a day (BID) | ORAL | Status: DC

## 2014-06-15 MED ORDER — BUPROPION HCL ER (XL) 300 MG PO TB24: 300.0000 mg | ORAL_TABLET | Freq: Every day | ORAL | Status: DC

## 2014-06-15 NOTE — Telephone Encounter (Signed)
Medications was sent to the patients pharmacy

## 2014-06-15 NOTE — Telephone Encounter (Signed)
Refill request for bupropion xl 300mg  #30, metoprolol 50mg  #60 [facility]

## 2014-07-17 ENCOUNTER — Telehealth: Payer: Self-pay | Admitting: Internal Medicine

## 2014-07-17 ENCOUNTER — Other Ambulatory Visit: Payer: Self-pay | Admitting: Family Medicine

## 2014-07-17 MED ORDER — BUPROPION HCL ER (XL) 300 MG PO TB24: 300.0000 mg | ORAL_TABLET | Freq: Every day | ORAL | Status: DC

## 2014-07-17 MED ORDER — METOPROLOL TARTRATE 50 MG PO TABS: 50.0000 mg | ORAL_TABLET | Freq: Two times a day (BID) | ORAL | Status: DC

## 2014-07-17 NOTE — Telephone Encounter (Signed)
Refill request for metoprolol 50mg  #60, bupropion 300mg  #[facility]

## 2014-07-17 NOTE — Telephone Encounter (Signed)
2 Rx was sent to his pharmacy. Medications listed below

## 2014-08-16 ENCOUNTER — Other Ambulatory Visit: Payer: Self-pay

## 2014-08-16 ENCOUNTER — Telehealth: Payer: Self-pay | Admitting: Internal Medicine

## 2014-08-16 MED ORDER — METOPROLOL TARTRATE 50 MG PO TABS: 50.0000 mg | ORAL_TABLET | Freq: Two times a day (BID) | ORAL | Status: DC

## 2014-08-16 NOTE — Telephone Encounter (Signed)
[facility]mg  #60 [facility]

## 2014-08-21 ENCOUNTER — Telehealth: Payer: Self-pay | Admitting: Medical

## 2014-08-21 NOTE — Telephone Encounter (Signed)
Pt. Called in stating he is having some memory issues that he believes the wellbutrin 300mg  may be causing.Pt. Also states he wishes to be weened off the medication if possible. He states that he started on wellbutrin 150mg . He also says he is running  low on all his medications should he come in for an appt. Or could you refill without him being seen? [facility].

## 2014-08-22 NOTE — Telephone Encounter (Signed)
He is due for appt, so please schedule.  Have him take Wellbutrin every other day for a week, then every 3 days for a week, then stop

## 2014-08-23 NOTE — Telephone Encounter (Signed)
I left a detailed message on the patients voicemail about Kristian CoveyShane Tysinger PA instructions

## 2014-09-07 ENCOUNTER — Encounter: Payer: Self-pay | Admitting: Family Medicine

## 2014-09-07 ENCOUNTER — Ambulatory Visit (INDEPENDENT_AMBULATORY_CARE_PROVIDER_SITE_OTHER): Payer: BC Managed Care – PPO | Admitting: Family Medicine

## 2014-09-07 VITALS — BP 126/80 | HR 82 | Wt 196.0 lb

## 2014-09-07 DIAGNOSIS — E785 Hyperlipidemia, unspecified: Secondary | ICD-10-CM | POA: Diagnosis not present

## 2014-09-07 DIAGNOSIS — F900 Attention-deficit hyperactivity disorder, predominantly inattentive type: Secondary | ICD-10-CM | POA: Diagnosis not present

## 2014-09-07 DIAGNOSIS — Z720 Tobacco use: Secondary | ICD-10-CM

## 2014-09-07 DIAGNOSIS — R000 Tachycardia, unspecified: Secondary | ICD-10-CM | POA: Diagnosis not present

## 2014-09-07 DIAGNOSIS — L821 Other seborrheic keratosis: Secondary | ICD-10-CM

## 2014-09-07 DIAGNOSIS — F17200 Nicotine dependence, unspecified, uncomplicated: Secondary | ICD-10-CM

## 2014-09-07 MED ORDER — ATORVASTATIN CALCIUM 20 MG PO TABS: 20.0000 mg | ORAL_TABLET | Freq: Every day | ORAL | Status: DC

## 2014-09-07 NOTE — Progress Notes (Signed)
   Subjective:    Patient ID: David Jensen, male    DOB: 1960/12/09, 54 y.o.   MRN: [id number]  HPI He is here for consult. He has noted some light difficulty with loose stools and flatulence. He has had a colonoscopy as well as endoscopy. He describes what sounds like an esophageal stricture and was on Prilosec per he has not taken this recently. He has no reflux type symptoms. He was also placed on Lopressor for treatment of tachycardia however this is around the time that his wife died. He seems to be processing the fact that his wife died fairly well. It was in 2022/11/23 of last year and quite sudden. He continues on Lipitor without difficulty. He smokes a pipe and is not interested in quitting. He is slowly tapering off of his Wellbutrin. He also has a lesion on his right forehead that he would like me to evaluate. He does have underlying ADHD however rarely uses his medication. He uses it when he needs to focus. His last lipid panel was November.  Review of Systems     Objective:   Physical Exam Alert and in no distress with appropriate affect.Round slightly pigmented lesion with well-demarcated margins are noted on the right forehead near the hairline.      Assessment & Plan:  Seborrheic keratosis  Smoker  Attention deficit hyperactivity disorder (ADHD), predominantly inattentive type  Hyperlipidemia LDL goal <100  Tachycardia  Dyslipidemia - Plan: atorvastatin (LIPITOR) 20 MG tablet I recommend he stop taking the Lopressor and keep track of his heart rate to see if he truly needs it. Also recommended using Metamucil to help with the stool situation. He will continue on Lipitor. Discussed the death of his wife in regard to his thoughts and feelings concerning this. He does seem to be handling this fairly well. Over 25 minutes, 50% of it spent in coordination of care and counseling.

## 2014-12-28 DIAGNOSIS — Z0279 Encounter for issue of other medical certificate: Secondary | ICD-10-CM

## 2015-03-01 ENCOUNTER — Telehealth: Payer: Self-pay

## 2015-03-01 NOTE — Telephone Encounter (Signed)
[facility]

## 2015-05-21 ENCOUNTER — Telehealth: Payer: Self-pay | Admitting: Family Medicine

## 2015-05-21 DIAGNOSIS — E785 Hyperlipidemia, unspecified: Secondary | ICD-10-CM

## 2015-05-21 MED ORDER — ATORVASTATIN CALCIUM 20 MG PO TABS: 20.0000 mg | ORAL_TABLET | Freq: Every day | ORAL | Status: DC

## 2015-05-21 NOTE — Telephone Encounter (Signed)
Lipitor refilled.

## 2015-05-21 NOTE — Telephone Encounter (Signed)
Pt made CPE appt for 06/11/15. Requesting refill on Lipitor to last until that appt

## 2015-06-11 ENCOUNTER — Encounter: Payer: Self-pay | Admitting: Family Medicine

## 2015-06-11 ENCOUNTER — Ambulatory Visit (INDEPENDENT_AMBULATORY_CARE_PROVIDER_SITE_OTHER): Payer: BC Managed Care – PPO | Admitting: Family Medicine

## 2015-06-11 VITALS — BP 120/82 | HR 80 | Ht 74.0 in | Wt 191.0 lb

## 2015-06-11 DIAGNOSIS — Z125 Encounter for screening for malignant neoplasm of prostate: Secondary | ICD-10-CM

## 2015-06-11 DIAGNOSIS — Z8249 Family history of ischemic heart disease and other diseases of the circulatory system: Secondary | ICD-10-CM | POA: Diagnosis not present

## 2015-06-11 DIAGNOSIS — Z720 Tobacco use: Secondary | ICD-10-CM | POA: Diagnosis not present

## 2015-06-11 DIAGNOSIS — Z23 Encounter for immunization: Secondary | ICD-10-CM

## 2015-06-11 DIAGNOSIS — Z209 Contact with and (suspected) exposure to unspecified communicable disease: Secondary | ICD-10-CM

## 2015-06-11 DIAGNOSIS — F900 Attention-deficit hyperactivity disorder, predominantly inattentive type: Secondary | ICD-10-CM

## 2015-06-11 DIAGNOSIS — F17200 Nicotine dependence, unspecified, uncomplicated: Secondary | ICD-10-CM

## 2015-06-11 DIAGNOSIS — E785 Hyperlipidemia, unspecified: Secondary | ICD-10-CM | POA: Diagnosis not present

## 2015-06-11 DIAGNOSIS — Z0000 Encounter for general adult medical examination without abnormal findings: Secondary | ICD-10-CM

## 2015-06-11 LAB — CBC WITH DIFFERENTIAL/PLATELET
BASOS ABS: 0.1 10*3/uL (ref 0.0–0.1)
Basophils Relative: 1 % (ref 0–1)
Eosinophils Absolute: 0.2 10*3/uL (ref 0.0–0.7)
Eosinophils Relative: 3 % (ref 0–5)
HEMATOCRIT: 48.9 % (ref 39.0–52.0)
HEMOGLOBIN: 16.6 g/dL (ref 13.0–17.0)
LYMPHS ABS: 1.2 10*3/uL (ref 0.7–4.0)
LYMPHS PCT: 19 % (ref 12–46)
MCH: 34.3 pg — ABNORMAL HIGH (ref 26.0–34.0)
MCHC: 33.9 g/dL (ref 30.0–36.0)
MCV: 101 fL — AB (ref 78.0–100.0)
MPV: 12.8 fL — ABNORMAL HIGH (ref 8.6–12.4)
Monocytes Absolute: 0.7 10*3/uL (ref 0.1–1.0)
Monocytes Relative: 11 % (ref 3–12)
Neutro Abs: 4.2 10*3/uL (ref 1.7–7.7)
Neutrophils Relative %: 66 % (ref 43–77)
Platelets: 181 10*3/uL (ref 150–400)
RBC: 4.84 MIL/uL (ref 4.22–5.81)
RDW: 13 % (ref 11.5–15.5)
WBC: 6.4 10*3/uL (ref 4.0–10.5)

## 2015-06-11 LAB — COMPREHENSIVE METABOLIC PANEL WITH GFR
ALT: 16 U/L (ref 9–46)
AST: 21 U/L (ref 10–35)
Albumin: 4.5 g/dL (ref 3.6–5.1)
Alkaline Phosphatase: 52 U/L (ref 40–115)
BUN: 19 mg/dL (ref 7–25)
CO2: 26 mmol/L (ref 20–31)
Calcium: 9.7 mg/dL (ref 8.6–10.3)
Chloride: 101 mmol/L (ref 98–110)
Creat: 0.9 mg/dL (ref 0.70–1.33)
Glucose, Bld: 102 mg/dL — ABNORMAL HIGH (ref 65–99)
Potassium: 5.2 mmol/L (ref 3.5–5.3)
Sodium: 137 mmol/L (ref 135–146)
Total Bilirubin: 0.7 mg/dL (ref 0.2–1.2)
Total Protein: 7.4 g/dL (ref 6.1–8.1)

## 2015-06-11 LAB — POCT URINALYSIS DIPSTICK
Bilirubin, UA: NEGATIVE
Blood, UA: NEGATIVE
Glucose, UA: NEGATIVE
Ketones, UA: NEGATIVE
Leukocytes, UA: NEGATIVE
Nitrite, UA: NEGATIVE
Protein, UA: NEGATIVE
Spec Grav, UA: >=1.03
Urobilinogen, UA: NEGATIVE
pH, UA: 6

## 2015-06-11 LAB — LIPID PANEL
CHOLESTEROL: 170 mg/dL (ref 125–200)
HDL: 61 mg/dL (ref >=40)
LDL Cholesterol: 98 mg/dL (ref <130)
TRIGLYCERIDES: 54 mg/dL (ref <150)
Total CHOL/HDL Ratio: 2.8 Ratio (ref <=5.0)
VLDL: 11 mg/dL (ref <30)

## 2015-06-11 MED ORDER — ATORVASTATIN CALCIUM 20 MG PO TABS: 20.0000 mg | ORAL_TABLET | Freq: Every day | ORAL | Status: DC

## 2015-06-11 NOTE — Progress Notes (Signed)
Subjective:    Patient ID: David Jensen, male    DOB: [birth year], 55 y.o.   MRN: [id number]  HPI He is here for complete examination. His daughter thinks that he has a cough and asked him to get further evaluation. He does have a previous history of smoking cigarettes but over the last 15+ years he has been smoking a pipe and admits to rarely inhaling pipe smoke. He does not complain of cough, chest pain, shortness of breath. He does have a family history of heart disease and presently is on Lipitor for treatment of cholesterol. He is having no difficulty with that medication. He also has underlying ADD but rarely uses any medication. In the past he had been on Wellbutrin after his wife died but presently is not taking this nor is he taking Lopressor. He has had no difficulty with reflux and therefore is not taking a PPI. He is dating someone and STD testing will be done. Family and social history as well as health maintenance and immunizations were all reviewed. His exercise is quite minimal. Alcohol consumption is one or 2 for night.   Review of Systems  All other systems reviewed and are negative.      Objective:   Physical Exam BP 120/82 mmHg  Pulse 80  Ht  (1.88 m)  Wt 191 lb (86.637 kg)  BMI 24.51 kg/m2  SpO2 98%  General Appearance:    Alert, cooperative, no distress, appears stated age  Head:    Normocephalic, without obvious abnormality, atraumatic  Eyes:    PERRL, conjunctiva/corneas clear, EOM's intact, fundi    benign  Ears:    Normal TM's and external ear canals  Nose:   Nares normal, mucosa normal, no drainage or sinus   tenderness  Throat:   Lips, mucosa, and tongue normal; teeth and gums normal  Neck:   Supple, no lymphadenopathy;  thyroid:  no   enlargement/tenderness/nodules; no carotid   bruit or JVD  Back:    Spine nontender, no curvature, ROM normal, no CVA     tenderness  Lungs:     Clear to auscultation bilaterally without wheezes, rales or     ronchi;  respirations unlabored  Chest Wall:    No tenderness or deformity   Heart:    Regular rate and rhythm, S1 and S2 normal, no murmur, rub   or gallop  Breast Exam:    No chest wall tenderness, masses or gynecomastia  Abdomen:     Soft, non-tender, nondistended, normoactive bowel sounds,    no masses, no hepatosplenomegaly        Extremities:   No clubbing, cyanosis or edema  Pulses:   2+ and symmetric all extremities  Skin:   Skin color, texture, turgor normal, no rashes or lesions  Lymph nodes:   Cervical, supraclavicular, and axillary nodes normal  Neurologic:   CNII-XII intact, normal strength, sensation and gait; reflexes 2+ and symmetric throughout          Psych:   Normal mood, affect, hygiene and grooming.          Assessment & Plan:  Routine general medical examination at a health care facility - Plan: POCT urinalysis dipstick, CBC with Differential/Platelet, Comprehensive metabolic panel, Lipid panel, PSA  Dyslipidemia - Plan: atorvastatin (LIPITOR) 20 MG tablet  Smoker  Attention deficit hyperactivity disorder (ADHD), predominantly inattentive type  Hyperlipidemia LDL goal <100  Contact with or exposure to communicable disease - Plan: HIV antibody, RPR  Need for prophylactic vaccination with combined diphtheria-tetanus-pertussis (DTP) vaccine - Plan: Tdap vaccine greater than or equal to 7yo IM  Screening for prostate cancer - Plan: PSA  Family history of heart disease in male family member before age 56 PSA screening and discussed with him. Also his coughing. At this point I will not pursue further pulmonary evaluation as I do not think he will call if I did do a limited CT. He was comfortable with that. I will follow-up concerning any potential STD exposure. Otherwise he seems to be doing fairly well.

## 2015-06-12 LAB — RPR

## 2015-06-12 LAB — PSA: PSA: 0.41 ng/mL (ref <=4.00)

## 2015-06-12 LAB — HIV ANTIBODY (ROUTINE TESTING W REFLEX): HIV: NONREACTIVE

## 2015-08-20 ENCOUNTER — Encounter: Payer: Self-pay | Admitting: Family Medicine

## 2015-08-20 ENCOUNTER — Ambulatory Visit (INDEPENDENT_AMBULATORY_CARE_PROVIDER_SITE_OTHER): Payer: BC Managed Care – PPO | Admitting: Family Medicine

## 2015-08-20 VITALS — BP 120/86 | HR 96 | Wt 192.8 lb

## 2015-08-20 DIAGNOSIS — M25461 Effusion, right knee: Secondary | ICD-10-CM | POA: Diagnosis not present

## 2015-08-20 NOTE — Patient Instructions (Signed)
2 Aleve twice per day the next 10-14 days. Don't do anything you know will make her

## 2015-08-20 NOTE — Progress Notes (Signed)
   Subjective:    Patient ID: Arvil ChacoJeffrey O Shadduck, male    DOB: 11/08/1960, 55 y.o.   MRN: [id number]  HPI  he complains of a 2 week history of right knee discomfort and occasional popping sensation as well as swelling. No grinding or giving way. No history of recent injury.   Review of Systems     Objective:   Physical Exam  a moderate effusion is present. He is tender over the medial joint line. McMurray's testing was negative. Negative anterior drawer. Medial and lateral collateral ligaments intact.       Assessment & Plan:  Knee effusion, right  this could possibly be a degenerative  Meniscus. I discussed this with him. Recommend conservative care with insight. If continued difficulty he will return here for further consultation.

## 2016-08-06 ENCOUNTER — Other Ambulatory Visit: Payer: Self-pay

## 2016-08-06 ENCOUNTER — Telehealth: Payer: Self-pay | Admitting: Family Medicine

## 2016-08-06 DIAGNOSIS — E785 Hyperlipidemia, unspecified: Secondary | ICD-10-CM

## 2016-08-06 MED ORDER — ATORVASTATIN CALCIUM 20 MG PO TABS: 20.0000 mg | ORAL_TABLET | Freq: Every day | ORAL | 0 refills | Status: DC

## 2016-08-06 NOTE — Telephone Encounter (Signed)
Pt made CPE appt on 09/03/16. Requesting refill on Lipitor 20 mg to last until appointment

## 2016-08-06 NOTE — Telephone Encounter (Signed)
Med sent in.

## 2016-09-03 ENCOUNTER — Ambulatory Visit (INDEPENDENT_AMBULATORY_CARE_PROVIDER_SITE_OTHER): Payer: BC Managed Care – PPO | Admitting: Family Medicine

## 2016-09-03 ENCOUNTER — Encounter: Payer: Self-pay | Admitting: Family Medicine

## 2016-09-03 VITALS — BP 124/80 | HR 91 | Ht 74.0 in | Wt 198.0 lb

## 2016-09-03 DIAGNOSIS — F17200 Nicotine dependence, unspecified, uncomplicated: Secondary | ICD-10-CM

## 2016-09-03 DIAGNOSIS — E785 Hyperlipidemia, unspecified: Secondary | ICD-10-CM | POA: Diagnosis not present

## 2016-09-03 DIAGNOSIS — Z0000 Encounter for general adult medical examination without abnormal findings: Secondary | ICD-10-CM | POA: Diagnosis not present

## 2016-09-03 DIAGNOSIS — Z8249 Family history of ischemic heart disease and other diseases of the circulatory system: Secondary | ICD-10-CM | POA: Diagnosis not present

## 2016-09-03 DIAGNOSIS — Z125 Encounter for screening for malignant neoplasm of prostate: Secondary | ICD-10-CM

## 2016-09-03 DIAGNOSIS — F900 Attention-deficit hyperactivity disorder, predominantly inattentive type: Secondary | ICD-10-CM | POA: Diagnosis not present

## 2016-09-03 LAB — COMPREHENSIVE METABOLIC PANEL
ALT: 12 U/L (ref 9–46)
AST: 16 U/L (ref 10–35)
Albumin: 3.9 g/dL (ref 3.6–5.1)
Alkaline Phosphatase: 55 U/L (ref 40–115)
BUN: 13 mg/dL (ref 7–25)
CHLORIDE: 104 mmol/L (ref 98–110)
CO2: 24 mmol/L (ref 20–31)
Calcium: 8.9 mg/dL (ref 8.6–10.3)
Creat: 0.87 mg/dL (ref 0.70–1.33)
GLUCOSE: 93 mg/dL (ref 65–99)
POTASSIUM: 4.2 mmol/L (ref 3.5–5.3)
Sodium: 136 mmol/L (ref 135–146)
Total Bilirubin: 0.9 mg/dL (ref 0.2–1.2)
Total Protein: 6.7 g/dL (ref 6.1–8.1)

## 2016-09-03 LAB — CBC WITH DIFFERENTIAL/PLATELET
BASOS ABS: 0 {cells}/uL (ref 0–200)
Basophils Relative: 0 %
EOS ABS: 164 {cells}/uL (ref 15–500)
Eosinophils Relative: 2 %
HCT: 46.6 % (ref 38.5–50.0)
Hemoglobin: 16.5 g/dL (ref 13.2–17.1)
Lymphocytes Relative: 16 %
Lymphs Abs: 1312 cells/uL (ref 850–3900)
MCH: 35.3 pg — AB (ref 27.0–33.0)
MCHC: 35.4 g/dL (ref 32.0–36.0)
MCV: 99.8 fL (ref 80.0–100.0)
MONOS PCT: 7 %
MPV: 12.4 fL (ref 7.5–12.5)
Monocytes Absolute: 574 cells/uL (ref 200–950)
NEUTROS PCT: 75 %
Neutro Abs: 6150 cells/uL (ref 1500–7800)
PLATELETS: 176 10*3/uL (ref 140–400)
RBC: 4.67 MIL/uL (ref 4.20–5.80)
RDW: 12.9 % (ref 11.0–15.0)
WBC: 8.2 10*3/uL (ref 4.0–10.5)

## 2016-09-03 LAB — LIPID PANEL
CHOL/HDL RATIO: 2.6 ratio (ref <5.0)
CHOLESTEROL: 140 mg/dL (ref <200)
HDL: 53 mg/dL (ref >40)
LDL Cholesterol: 76 mg/dL (ref <100)
Triglycerides: 56 mg/dL (ref <150)
VLDL: 11 mg/dL (ref <30)

## 2016-09-03 LAB — POCT URINALYSIS DIPSTICK
BILIRUBIN UA: NEGATIVE
Glucose, UA: NEGATIVE
KETONES UA: NEGATIVE
Leukocytes, UA: NEGATIVE
NITRITE UA: NEGATIVE
PH UA: 6 (ref 5.0–8.0)
Protein, UA: NEGATIVE
RBC UA: NEGATIVE
Spec Grav, UA: 1.025 (ref 1.010–1.025)
Urobilinogen, UA: 0.2 E.U./dL

## 2016-09-03 MED ORDER — ATORVASTATIN CALCIUM 20 MG PO TABS: 20.0000 mg | ORAL_TABLET | Freq: Every day | ORAL | 3 refills | Status: DC

## 2016-09-03 MED ORDER — METHYLPHENIDATE HCL 10 MG PO TABS: 10.0000 mg | ORAL_TABLET | Freq: Two times a day (BID) | ORAL | 0 refills | Status: DC

## 2016-09-03 NOTE — Progress Notes (Signed)
Subjective:    Patient ID: David Jensen, male    DOB: 03/01/1961, 56 y.o.   MRN: [id number]  HPI He is here for a complete examination. He does have underlying hyperlipidemia and presently is doing quite nicely on Lipitor. There is also family history of heart disease. He has seen cardiology in the past. It was recommended that he get a cardiac calcium score however he did not get it due to cost. He's had no chest pain, shortness of breath, PND. He does smoke up height and is not interested in quitting. He also has an underlying history of ADD. In the past he had used Ritalin long-acting but has not had any in several years. He seems be doing fairly well without it but would like some for occasions when he needs to focus. He otherwise has no concerns or complaints. Family and social history as well as health maintenance and immunizations were reviewed He did have a colonoscopy in 2012 and was told it was inadequate and need to do repeat repeated in 5 years. This is a common refrain from this particular GI office making it less valid.  Review of Systems  All other systems reviewed and are negative.      Objective:   Physical Exam BP 124/80   Pulse 91   Ht 6\' 2"  (1.88 m)   Wt 198 lb (89.8 kg)   SpO2 96%   BMI 25.42 kg/m   General Appearance:    Alert, cooperative, no distress, appears stated age  Head:    Normocephalic, without obvious abnormality, atraumatic  Eyes:    PERRL, conjunctiva/corneas clear, EOM's intact, fundi    benign  Ears:    Normal TM's and external ear canals  Nose:   Nares normal, mucosa normal, no drainage or sinus   tenderness  Throat:   Lips, mucosa, and tongue normal; teeth and gums normal  Neck:   Supple, no lymphadenopathy;  thyroid:  no   enlargement/tenderness/nodules; no carotid   bruit or JVD     Lungs:     Clear to auscultation bilaterally without wheezes, rales or     ronchi; respirations unlabored      Heart:    Regular rate and rhythm, S1 and S2  normal, no murmur, rub   or gallop     Abdomen:     Soft, non-tender, nondistended, normoactive bowel sounds,    no masses, no hepatosplenomegaly  Genitalia:    Normal male external genitalia without lesions.  Testicles without masses.  No inguinal hernias.  Rectal:    Deferred   Extremities:   No clubbing, cyanosis or edema  Pulses:   2+ and symmetric all extremities  Skin:   Skin color, texture, turgor normal, no rashes or lesions  Lymph nodes:   Cervical, supraclavicular, and axillary nodes normal  Neurologic:   CNII-XII intact, normal strength, sensation and gait; reflexes 2+ and symmetric throughout          Psych:   Normal mood, affect, hygiene and grooming.          Assessment & Plan:  Routine general medical examination at a health care facility - Plan: POCT Urinalysis Dipstick, CBC with Differential/Platelet, Comprehensive metabolic panel, Lipid panel, PSA  Dyslipidemia - Plan: Lipid panel  Smoker  Attention deficit hyperactivity disorder (ADHD), predominantly inattentive type - Plan: methylphenidate (RITALIN) 10 MG tablet  Screening for prostate cancer - Plan: PSA  Family history of heart disease in male family member before age  55 - Plan: CBC with Differential/Platelet, Comprehensive metabolic panel, Lipid panel Encouraged him to check with his insurance to see the coverage for the cardiac calcium score. I will also give him stool cards in lieu of sending him off for colonoscopy. Continue his other medications. Cautioned on the use of sunscreen and continue to wear his seatbelt. Discussed the use of Ritalin with him. He will use it on an as-needed basis based on his need to be better focused. He is comfortable with that. He is also to let me know whether this dosing works, how long and if he has any side effects.

## 2016-09-04 LAB — PSA: PSA: 0.3 ng/mL (ref <=4.0)

## 2016-09-08 ENCOUNTER — Encounter: Payer: Self-pay | Admitting: Family Medicine

## 2016-09-10 ENCOUNTER — Telehealth: Payer: Self-pay | Admitting: Family Medicine

## 2016-09-10 NOTE — Telephone Encounter (Signed)
P.A. METHYLPHENIDATE HCI

## 2016-09-11 ENCOUNTER — Encounter: Payer: Self-pay | Admitting: Family Medicine

## 2016-09-13 NOTE — Telephone Encounter (Signed)
P.A. Approved til 09/11/19

## 2017-12-21 ENCOUNTER — Other Ambulatory Visit: Payer: Self-pay

## 2017-12-21 ENCOUNTER — Telehealth: Payer: Self-pay | Admitting: Family Medicine

## 2017-12-21 DIAGNOSIS — E785 Hyperlipidemia, unspecified: Secondary | ICD-10-CM

## 2017-12-21 MED ORDER — ATORVASTATIN CALCIUM 20 MG PO TABS: 20.0000 mg | ORAL_TABLET | Freq: Every day | ORAL | 3 refills | Status: DC

## 2017-12-21 NOTE — Telephone Encounter (Signed)
SENT IN. [facility]

## 2017-12-21 NOTE — Telephone Encounter (Signed)
Pt called and made a cpe for dec the 16th put needs a 90 day supply of his Lipitor to last him until his appt pt would like it sent to the [id number] - [place], [place] - [address]

## 2018-03-14 ENCOUNTER — Encounter: Payer: Self-pay | Admitting: Family Medicine

## 2018-03-14 ENCOUNTER — Ambulatory Visit: Payer: BC Managed Care – PPO | Admitting: Family Medicine

## 2018-03-14 VITALS — BP 108/72 | HR 98 | Temp 98.5°F | Ht 73.75 in | Wt 194.2 lb

## 2018-03-14 DIAGNOSIS — Z8249 Family history of ischemic heart disease and other diseases of the circulatory system: Secondary | ICD-10-CM

## 2018-03-14 DIAGNOSIS — Z23 Encounter for immunization: Secondary | ICD-10-CM | POA: Diagnosis not present

## 2018-03-14 DIAGNOSIS — E785 Hyperlipidemia, unspecified: Secondary | ICD-10-CM

## 2018-03-14 DIAGNOSIS — F17200 Nicotine dependence, unspecified, uncomplicated: Secondary | ICD-10-CM

## 2018-03-14 DIAGNOSIS — Z0000 Encounter for general adult medical examination without abnormal findings: Secondary | ICD-10-CM | POA: Diagnosis not present

## 2018-03-14 DIAGNOSIS — Z125 Encounter for screening for malignant neoplasm of prostate: Secondary | ICD-10-CM

## 2018-03-14 DIAGNOSIS — F900 Attention-deficit hyperactivity disorder, predominantly inattentive type: Secondary | ICD-10-CM | POA: Diagnosis not present

## 2018-03-14 DIAGNOSIS — Z1211 Encounter for screening for malignant neoplasm of colon: Secondary | ICD-10-CM

## 2018-03-14 DIAGNOSIS — G479 Sleep disorder, unspecified: Secondary | ICD-10-CM

## 2018-03-14 DIAGNOSIS — Z209 Contact with and (suspected) exposure to unspecified communicable disease: Secondary | ICD-10-CM

## 2018-03-14 LAB — POCT URINALYSIS DIP (PROADVANTAGE DEVICE)
Bilirubin, UA: NEGATIVE
Blood, UA: NEGATIVE
GLUCOSE UA: NEGATIVE mg/dL
Leukocytes, UA: NEGATIVE
NITRITE UA: NEGATIVE
Specific Gravity, Urine: 1.025
UUROB: 3.5
pH, UA: 6 (ref 5.0–8.0)

## 2018-03-14 MED ORDER — ATORVASTATIN CALCIUM 20 MG PO TABS: 20.0000 mg | ORAL_TABLET | Freq: Every day | ORAL | 3 refills | Status: DC

## 2018-03-14 MED ORDER — METHYLPHENIDATE HCL 10 MG PO TABS: 10.0000 mg | ORAL_TABLET | Freq: Two times a day (BID) | ORAL | 0 refills | Status: DC

## 2018-03-14 NOTE — Progress Notes (Signed)
Established Patient Office Visit  Subjective:  Patient ID: David Jensen, male    DOB: 12/22/1960  Age: 57 y.o. MRN: [id number]  CC:  Chief Complaint  Patient presents with  . other    Fasting CPE/ flu shot    HPI David Jensen presents for a complete examination.  He does state that he falls asleep easily but then has trouble staying asleep getting 5 or 6 hours which is 1 or 2-hour short of normal.  He has had no change in his life specifically increased stresses, alcohol or any other medications.  He does intermittently use melatonin.  He does state that he has a slight decrease in his appetite but is not seen any change in his weight.  He continues on atorvastatin.  There is a family history of heart disease.  He also uses Ritalin on an as-needed basis for his ADD.  He does state that it helps with focus but cannot really say how long it lasts.  He is dating someone and does not use condoms.  He agrees to be STD tested.Marland Kitchen.He continues to smoke a pipe and is not interested in stopping.  He did have a colonoscopy in 2013 and was told that it was an inadequate prep but did not follow-up.  He has no other concerns or complaints.  Past Medical History:  Diagnosis Date  . Cardiac disease    FAMILY HX  . Dyslipidemia     Past Surgical History:  Procedure Laterality Date  . VASECTOMY      Family History  Problem Relation Age of Onset  . Heart disease Father [id number]       Died with an MI  . Atrial fibrillation Mother   . Anxiety disorder Mother   . Cancer Maternal Grandmother        lung    Social History   Socioeconomic History  . Marital status: Married    Spouse name: Not on file  . Number of children: 2  . Years of education: Not on file  . Highest education level: Not on file  Occupational History  . Occupation: Engineer, drillingealtor    Employer: [employer]  Social Needs  . Financial resource strain: Not on file  . Food insecurity:    Worry: Not on file    Inability: Not on  file  . Transportation needs:    Medical: Not on file    Non-medical: Not on file  Tobacco Use  . Smoking status: Current Every Day Smoker    Packs/day: 0.00    Years: 30.00    Pack years: 0.00    Types: Pipe  . Smokeless tobacco: Never Used  Substance and Sexual Activity  . Alcohol use: Yes    Alcohol/week: 14.0 standard drinks    Types: 14 Glasses of wine per week  . Drug use: No  . Sexual activity: Yes  Lifestyle  . Physical activity:    Days per week: Not on file    Minutes per session: Not on file  . Stress: Not on file  Relationships  . Social connections:    Talks on phone: Not on file    Gets together: Not on file    Attends religious service: Not on file    Active member of club or organization: Not on file    Attends meetings of clubs or organizations: Not on file    Relationship status: Not on file  . Intimate partner violence:    Fear  of current or ex partner: Not on file    Emotionally abused: Not on file    Physically abused: Not on file    Forced sexual activity: Not on file  Other Topics Concern  . Not on file  Social History Narrative  . Not on file    Outpatient Medications Prior to Visit  Medication Sig Dispense Refill  . Multiple Vitamins-Minerals (MULTIVITAMIN WITH MINERALS) tablet Take 1 tablet by mouth daily.      Marland Kitchen atorvastatin (LIPITOR) 20 MG tablet Take 1 tablet (20 mg total) by mouth daily. 90 tablet 3  . methylphenidate (RITALIN) 10 MG tablet Take 1 tablet (10 mg total) by mouth 2 (two) times daily. 60 tablet 0   No facility-administered medications prior to visit.     Allergies  Allergen Reactions  . Codeine Nausea And Vomiting    ROS Review of Systems    Objective:    Physical Exam  BP 108/72 (BP Location: Left Arm, Patient Position: Sitting)   Pulse 98   Temp 98.5 F (36.9 C)   Ht 6' 1.75" (1.873 m)   Wt 194 lb 3.2 oz (88.1 kg)   SpO2 97%   BMI 25.10 kg/m  Wt Readings from Last 3 Encounters:  03/14/18 194 lb 3.2  oz (88.1 kg)  09/03/16 198 lb (89.8 kg)  08/20/15 192 lb 12.8 oz (87.5 kg)  BP 108/72 (BP Location: Left Arm, Patient Position: Sitting)   Pulse 98   Temp 98.5 F (36.9 C)   Ht 6' 1.75" (1.873 m)   Wt 194 lb 3.2 oz (88.1 kg)   SpO2 97%   BMI 25.10 kg/m   General Appearance:    Alert, cooperative, no distress, appears stated age  Head:    Normocephalic, without obvious abnormality, atraumatic  Eyes:    PERRL, conjunctiva/corneas clear, EOM's intact, fundi    benign  Ears:    Normal TM's and external ear canals  Nose:   Nares normal, mucosa normal, no drainage or sinus   tenderness  Throat:   Lips, mucosa, and tongue normal; teeth and gums normal  Neck:   Supple, no lymphadenopathy;  thyroid:  no   enlargement/tenderness/nodules; no carotid   bruit or JVD     Lungs:     Clear to auscultation bilaterally without wheezes, rales or     ronchi; respirations unlabored      Heart:    Regular rate and rhythm, S1 and S2 normal, no murmur, rub   or gallop     Abdomen:     Soft, non-tender, nondistended, normoactive bowel sounds,    no masses, no hepatosplenomegaly  Genitalia:  deferred  Rectal:  deferred  Extremities:   No clubbing, cyanosis or edema  Pulses:   2+ and symmetric all extremities  Skin:   Skin color, texture, turgor normal, no rashes or lesions  Lymph nodes:   Cervical, supraclavicular, and axillary nodes normal  Neurologic:   CNII-XII intact, normal strength, sensation and gait; reflexes 2+ and symmetric throughout          Psych:   Normal mood, affect, hygiene and grooming.       Health Maintenance Due  Topic Date Due  . INFLUENZA VACCINE  10/28/2017      Lab Results  Component Value Date   TSH 1.911 11/24/2013   Lab Results  Component Value Date   WBC 8.2 09/03/2016   HGB 16.5 09/03/2016   HCT 46.6 09/03/2016   MCV 99.8 09/03/2016  PLT 176 09/03/2016   Lab Results  Component Value Date   NA 136 09/03/2016   K 4.2 09/03/2016   CO2 24 09/03/2016     GLUCOSE 93 09/03/2016   BUN 13 09/03/2016   CREATININE 0.87 09/03/2016   BILITOT 0.9 09/03/2016   ALKPHOS 55 09/03/2016   AST 16 09/03/2016   ALT 12 09/03/2016   PROT 6.7 09/03/2016   ALBUMIN 3.9 09/03/2016   CALCIUM 8.9 09/03/2016   Lab Results  Component Value Date   CHOL 140 09/03/2016   Lab Results  Component Value Date   HDL 53 09/03/2016   Lab Results  Component Value Date   LDLCALC 76 09/03/2016   Lab Results  Component Value Date   TRIG 56 09/03/2016   Lab Results  Component Value Date   CHOLHDL 2.6 09/03/2016   No results found for: HGBA1C    Assessment & Plan:   Problem List Items Addressed This Visit    ADHD (attention deficit hyperactivity disorder)   Relevant Medications   methylphenidate (RITALIN) 10 MG tablet   Family history of heart disease in male family member before age 50   Relevant Orders   CBC with Differential/Platelet   Comprehensive metabolic panel   Lipid panel   Hyperlipidemia LDL goal <100   Relevant Medications   atorvastatin (LIPITOR) 20 MG tablet   Other Relevant Orders   Lipid panel   Smoker    Other Visit Diagnoses    Routine general medical examination at a health care facility    -  Primary   Relevant Orders   POCT Urinalysis DIP (Proadvantage Device) (Completed)   CBC with Differential/Platelet   Comprehensive metabolic panel   Lipid panel   Screening for colon cancer       Relevant Orders   Cologuard   Contact with or exposure to communicable disease       Relevant Orders   RPR   GC/Chlamydia Probe Amp   HIV Antibody (routine testing w rflx)   Need for influenza vaccination       Relevant Orders   Flu Vaccine QUAD 6+ mos PF IM (Fluarix Quad PF) (Completed)   Dyslipidemia       Relevant Medications   atorvastatin (LIPITOR) 20 MG tablet   Sleep disturbance       Screening for prostate cancer       Relevant Orders   PSA      Meds ordered this encounter  Medications  . methylphenidate (RITALIN) 10  MG tablet    Sig: Take 1 tablet (10 mg total) by mouth 2 (two) times daily.    Dispense:  60 tablet    Refill:  0  . atorvastatin (LIPITOR) 20 MG tablet    Sig: Take 1 tablet (20 mg total) by mouth daily.    Dispense:  90 tablet    Refill:  3  He will continue on his present medication regimen.  He is using the Ritalin on an as-needed basis.  He has had no chest pain, shortness of breath or other cardiac issues at the present time.  Continue on Lipitor.  Follow-up: No follow-ups on file.    Sharlot Gowda, MD

## 2018-03-15 LAB — LIPID PANEL
Chol/HDL Ratio: 2.5 ratio (ref 0.0–5.0)
Cholesterol, Total: 185 mg/dL (ref 100–199)
HDL: 74 mg/dL (ref >39)
LDL Calculated: 96 mg/dL (ref 0–99)
Triglycerides: 76 mg/dL (ref 0–149)
VLDL Cholesterol Cal: 15 mg/dL (ref 5–40)

## 2018-03-15 LAB — COMPREHENSIVE METABOLIC PANEL
A/G RATIO: 1.9 (ref 1.2–2.2)
ALK PHOS: 65 IU/L (ref 39–117)
ALT: 21 IU/L (ref 0–44)
AST: 30 IU/L (ref 0–40)
Albumin: 4.7 g/dL (ref 3.5–5.5)
BILIRUBIN TOTAL: 1 mg/dL (ref 0.0–1.2)
BUN/Creatinine Ratio: 15 (ref 9–20)
BUN: 15 mg/dL (ref 6–24)
CHLORIDE: 100 mmol/L (ref 96–106)
CO2: 22 mmol/L (ref 20–29)
Calcium: 9.5 mg/dL (ref 8.7–10.2)
Creatinine, Ser: 0.97 mg/dL (ref 0.76–1.27)
GFR calc Af Amer: 99 mL/min/{1.73_m2} (ref >59)
GFR calc non Af Amer: 86 mL/min/{1.73_m2} (ref >59)
GLUCOSE: 97 mg/dL (ref 65–99)
Globulin, Total: 2.5 g/dL (ref 1.5–4.5)
Potassium: 4.6 mmol/L (ref 3.5–5.2)
Sodium: 140 mmol/L (ref 134–144)
Total Protein: 7.2 g/dL (ref 6.0–8.5)

## 2018-03-15 LAB — CBC WITH DIFFERENTIAL/PLATELET
BASOS ABS: 0.1 10*3/uL (ref 0.0–0.2)
Basos: 1 %
EOS (ABSOLUTE): 0.1 10*3/uL (ref 0.0–0.4)
Eos: 1 %
Hematocrit: 46.2 % (ref 37.5–51.0)
Hemoglobin: 16.8 g/dL (ref 13.0–17.7)
Immature Grans (Abs): 0 10*3/uL (ref 0.0–0.1)
Immature Granulocytes: 0 %
Lymphocytes Absolute: 1.2 10*3/uL (ref 0.7–3.1)
Lymphs: 17 %
MCH: 36.4 pg — AB (ref 26.6–33.0)
MCHC: 36.4 g/dL — ABNORMAL HIGH (ref 31.5–35.7)
MCV: 100 fL — AB (ref 79–97)
MONOS ABS: 0.6 10*3/uL (ref 0.1–0.9)
Monocytes: 8 %
NEUTROS ABS: 5.3 10*3/uL (ref 1.4–7.0)
NEUTROS PCT: 73 %
PLATELETS: 176 10*3/uL (ref 150–450)
RBC: 4.62 x10E6/uL (ref 4.14–5.80)
RDW: 11.9 % — AB (ref 12.3–15.4)
WBC: 7.2 10*3/uL (ref 3.4–10.8)

## 2018-03-15 LAB — RPR: RPR: NONREACTIVE

## 2018-03-15 LAB — HIV ANTIBODY (ROUTINE TESTING W REFLEX): HIV SCREEN 4TH GENERATION: NONREACTIVE

## 2018-03-16 LAB — GC/CHLAMYDIA PROBE AMP
CHLAMYDIA, DNA PROBE: NEGATIVE
Neisseria gonorrhoeae by PCR: NEGATIVE

## 2018-03-22 ENCOUNTER — Encounter: Payer: Self-pay | Admitting: Family Medicine

## 2018-03-22 LAB — SPECIMEN STATUS REPORT

## 2018-03-22 LAB — PSA: Prostate Specific Ag, Serum: 0.4 ng/mL (ref 0.0–4.0)

## 2018-05-09 ENCOUNTER — Encounter: Payer: Self-pay | Admitting: Family Medicine

## 2018-05-09 ENCOUNTER — Ambulatory Visit: Payer: BC Managed Care – PPO | Admitting: Family Medicine

## 2018-05-09 VITALS — BP 128/84 | HR 94 | Temp 98.2°F | Wt 195.0 lb

## 2018-05-09 DIAGNOSIS — M7711 Lateral epicondylitis, right elbow: Secondary | ICD-10-CM | POA: Diagnosis not present

## 2018-05-09 NOTE — Progress Notes (Signed)
   Subjective:    Patient ID: David Jensen, male    DOB: November 16, 1960, 58 y.o.   MRN: [id number]  HPI He has a several month history of right elbow pain that started after a lot of activity from bowling and playing golf.  He noted especially when he was playing golf and he had difficulty gripping the club and had to quit playing after 12 holes.   Review of Systems     Objective:   Physical Exam Alert and in no distress.  Right elbow shows full motion.  Tenderness palpation is noted over the lateral epicondyle especially with provocative testing.       Assessment & Plan:  Lateral epicondylitis of right elbow I recommended conservative care with heat for 20 minutes 3 times per day, 2 Aleve twice per day and do as many things as possible palms up and open.  Discussed the possibility of being more aggressive if this is not successful.

## 2018-05-09 NOTE — Patient Instructions (Signed)
Heat for 20 minutes 3 times per day and 2 Aleve twice per day and to do as many things palms up and open

## 2018-05-19 ENCOUNTER — Telehealth: Payer: Self-pay

## 2018-05-19 NOTE — Telephone Encounter (Signed)
Pt was called and lvm to advise him of need to complete the cologuard.

## 2018-05-20 LAB — COLOGUARD

## 2019-01-20 ENCOUNTER — Other Ambulatory Visit: Payer: Self-pay

## 2019-01-20 DIAGNOSIS — Z20822 Contact with and (suspected) exposure to covid-19: Secondary | ICD-10-CM

## 2019-01-21 LAB — NOVEL CORONAVIRUS, NAA: SARS-CoV-2, NAA: NOT DETECTED

## 2019-02-13 ENCOUNTER — Other Ambulatory Visit: Payer: Self-pay | Admitting: Family Medicine

## 2019-02-13 ENCOUNTER — Telehealth: Payer: Self-pay

## 2019-02-13 ENCOUNTER — Other Ambulatory Visit: Payer: Self-pay

## 2019-02-13 DIAGNOSIS — E785 Hyperlipidemia, unspecified: Secondary | ICD-10-CM

## 2019-02-13 NOTE — Telephone Encounter (Signed)
Received fax from [facility] for a refill on Atorvasatin #90 with 3 refills. Pt. Last apt was 05/09/18 and last CPE 03/14/18

## 2019-02-13 NOTE — Telephone Encounter (Signed)
appt  Made and med sent in. [facility]

## 2019-02-22 ENCOUNTER — Ambulatory Visit (INDEPENDENT_AMBULATORY_CARE_PROVIDER_SITE_OTHER): Payer: BC Managed Care – PPO | Admitting: Family Medicine

## 2019-02-22 ENCOUNTER — Encounter: Payer: Self-pay | Admitting: Family Medicine

## 2019-02-22 ENCOUNTER — Other Ambulatory Visit: Payer: Self-pay

## 2019-02-22 VITALS — BP 126/84 | HR 92 | Temp 97.1°F | Wt 193.4 lb

## 2019-02-22 DIAGNOSIS — D692 Other nonthrombocytopenic purpura: Secondary | ICD-10-CM

## 2019-02-22 DIAGNOSIS — F17200 Nicotine dependence, unspecified, uncomplicated: Secondary | ICD-10-CM

## 2019-02-22 DIAGNOSIS — F900 Attention-deficit hyperactivity disorder, predominantly inattentive type: Secondary | ICD-10-CM

## 2019-02-22 DIAGNOSIS — Z8249 Family history of ischemic heart disease and other diseases of the circulatory system: Secondary | ICD-10-CM

## 2019-02-22 DIAGNOSIS — N529 Male erectile dysfunction, unspecified: Secondary | ICD-10-CM | POA: Diagnosis not present

## 2019-02-22 DIAGNOSIS — E785 Hyperlipidemia, unspecified: Secondary | ICD-10-CM | POA: Diagnosis not present

## 2019-02-22 DIAGNOSIS — R9431 Abnormal electrocardiogram [ECG] [EKG]: Secondary | ICD-10-CM | POA: Diagnosis not present

## 2019-02-22 MED ORDER — SILDENAFIL CITRATE 20 MG PO TABS: ORAL_TABLET | ORAL | 5 refills | Status: DC

## 2019-02-22 NOTE — Progress Notes (Signed)
   Subjective:    Patient ID: David Jensen, male    DOB: 1961-01-16, 58 y.o.   MRN: [id number]  HPI He is here for consult concerning multiple issues.  He does have lesions on his face and on his arms that he would like further evaluated.  He also states that people are commenting about his memory stating he seems to be forgetful.  He does have an underlying history of ADD and notes that it does help with his focus.  He has not noted any memory issues.  He does smoke a pipe and enjoys it.  At this point he has no desire to quit.  He is now dating someone and has noted slight difficulty with erectile dysfunction.  He is able to achieve an erection but is not as strong as he would like.  They is also an apparent issue with the girlfriend saying his wife's picture is in the house.  His wife died several years ago cardiac related issues.  He also has had some sleep related issues and is using melatonin but notes that he does tend to wake up periodically.  He also has a family history of heart disease and has seen Dr. Percival Spanish in the past.  Presently he is having no chest pain, shortness of breath or PND cardiac calcium score was recommended but he has not accomplished that yet.  He has no other concerns or complaints.   Review of Systems     Objective:   Physical Exam Alert and in no distress. Tympanic membranes and canals are normal. Pharyngeal area is normal. Neck is supple without adenopathy or thyromegaly.  Purpuric lesions noted on both forearms.  Exam of his face shows no apparent issues.  Cardiac exam shows a regular sinus rhythm without murmurs or gallops. Lungs are clear to auscultation. EKG shows no acute changes.       Assessment & Plan:  Family history of heart disease in male family member before age 110 - Plan: CT CARDIAC SCORING, EKG 12-Lead, CBC with Differential, Comprehensive metabolic panel, Lipid panel  Erectile dysfunction, unspecified erectile dysfunction type - Plan:  sildenafil (REVATIO) 20 MG tablet: I discussed proper use of this and possible side effects with him.  Abnormal EKG: He has a previous history of abnormal EKG and evaluation by cardiology.  He will see what the cardiac score shows. Hyperlipidemia LDL goal <100 - Plan: Lipid panel  Attention deficit hyperactivity disorder (ADHD), predominantly inattentive type: I explained that they memory issues is probably more of an inattention issue with him and doubt that he truly has any major memory dysfunction. Abnormal electrocardiogram - Plan: CT CARDIAC SCORING, EKG 12-Lead, CBC with Differential, Comprehensive metabolic panel, Lipid panel  Senile purpura ([place]): Explained that this is nothing in particular to be worried about.  Smoker - pipe  I then also discussed possibly getting involved in counseling to help with how to handle the problem of his girlfriend and having pictures of his now deceased wife in the house. Over 45 minutes, greater than 50% spent in counseling and coordination of care.

## 2019-02-22 NOTE — Patient Instructions (Signed)
David Jensen [phone] 

## 2019-02-23 LAB — CBC WITH DIFFERENTIAL/PLATELET
Basophils Absolute: 0.1 10*3/uL (ref 0.0–0.2)
Basos: 1 %
EOS (ABSOLUTE): 0.1 10*3/uL (ref 0.0–0.4)
Eos: 2 %
Hematocrit: 46.9 % (ref 37.5–51.0)
Hemoglobin: 16 g/dL (ref 13.0–17.7)
Immature Grans (Abs): 0 10*3/uL (ref 0.0–0.1)
Immature Granulocytes: 0 %
Lymphocytes Absolute: 1.3 10*3/uL (ref 0.7–3.1)
Lymphs: 18 %
MCH: 34.1 pg — ABNORMAL HIGH (ref 26.6–33.0)
MCHC: 34.1 g/dL (ref 31.5–35.7)
MCV: 100 fL — ABNORMAL HIGH (ref 79–97)
Monocytes Absolute: 0.5 10*3/uL (ref 0.1–0.9)
Monocytes: 7 %
Neutrophils Absolute: 5.1 10*3/uL (ref 1.4–7.0)
Neutrophils: 72 %
Platelets: 189 10*3/uL (ref 150–450)
RBC: 4.69 x10E6/uL (ref 4.14–5.80)
RDW: 11.9 % (ref 11.6–15.4)
WBC: 7.1 10*3/uL (ref 3.4–10.8)

## 2019-02-23 LAB — LIPID PANEL
Chol/HDL Ratio: 2.6 ratio (ref 0.0–5.0)
Cholesterol, Total: 172 mg/dL (ref 100–199)
HDL: 67 mg/dL (ref >39)
LDL Chol Calc (NIH): 89 mg/dL (ref 0–99)
Triglycerides: 86 mg/dL (ref 0–149)
VLDL Cholesterol Cal: 16 mg/dL (ref 5–40)

## 2019-02-23 LAB — COMPREHENSIVE METABOLIC PANEL
ALT: 16 IU/L (ref 0–44)
AST: 22 IU/L (ref 0–40)
Albumin/Globulin Ratio: 1.7 (ref 1.2–2.2)
Albumin: 4.5 g/dL (ref 3.8–4.9)
Alkaline Phosphatase: 61 IU/L (ref 39–117)
BUN/Creatinine Ratio: 14 (ref 9–20)
BUN: 13 mg/dL (ref 6–24)
Bilirubin Total: 0.6 mg/dL (ref 0.0–1.2)
CO2: 22 mmol/L (ref 20–29)
Calcium: 9.2 mg/dL (ref 8.7–10.2)
Chloride: 102 mmol/L (ref 96–106)
Creatinine, Ser: 0.96 mg/dL (ref 0.76–1.27)
GFR calc Af Amer: 100 mL/min/{1.73_m2} (ref >59)
GFR calc non Af Amer: 86 mL/min/{1.73_m2} (ref >59)
Globulin, Total: 2.6 g/dL (ref 1.5–4.5)
Glucose: 85 mg/dL (ref 65–99)
Potassium: 5.1 mmol/L (ref 3.5–5.2)
Sodium: 141 mmol/L (ref 134–144)
Total Protein: 7.1 g/dL (ref 6.0–8.5)

## 2019-03-09 ENCOUNTER — Encounter: Payer: Self-pay | Admitting: Family Medicine

## 2019-03-09 ENCOUNTER — Ambulatory Visit
Admission: RE | Admit: 2019-03-09 | Discharge: 2019-03-09 | Disposition: A | Discharge status: Home / Self Care | Payer: BC Managed Care – PPO | Source: Ambulatory Visit | Attending: Family Medicine | Admitting: Family Medicine

## 2019-03-09 DIAGNOSIS — Z8249 Family history of ischemic heart disease and other diseases of the circulatory system: Secondary | ICD-10-CM

## 2019-03-09 DIAGNOSIS — R9431 Abnormal electrocardiogram [ECG] [EKG]: Secondary | ICD-10-CM

## 2019-03-09 DIAGNOSIS — I2510 Atherosclerotic heart disease of native coronary artery without angina pectoris: Secondary | ICD-10-CM

## 2019-03-10 ENCOUNTER — Telehealth: Payer: Self-pay

## 2019-03-10 NOTE — Telephone Encounter (Signed)
Called pt to advise that cardiology office has tried to reach him. Advise pt of the number wihich is [phone]. LVM Braman

## 2019-03-15 ENCOUNTER — Other Ambulatory Visit: Payer: Self-pay

## 2019-03-15 DIAGNOSIS — Z1211 Encounter for screening for malignant neoplasm of colon: Secondary | ICD-10-CM

## 2019-04-05 DIAGNOSIS — R931 Abnormal findings on diagnostic imaging of heart and coronary circulation: Secondary | ICD-10-CM | POA: Insufficient documentation

## 2019-04-05 DIAGNOSIS — Z7189 Other specified counseling: Secondary | ICD-10-CM | POA: Insufficient documentation

## 2019-04-05 NOTE — Progress Notes (Signed)
Cardiology Office Note   Date:  04/06/2019   ID:  David Jensen, DOB Aug 11, 1960, MRN [id number]  PCP:  Ronnald Nian, MD  Cardiologist:   No primary care provider on file. Referring:  Ronnald Nian, MD  Chief Complaint  Patient presents with  . Elevated Coronary Calcium      History of Present Illness: David Jensen is a 59 y.o. male who presents for followup of cardiovascular risk factors. I saw him in 2007 and again in 2017.  He had family history of early coronary artery disease but he had a negative treadmill test.  Since I last saw him he had a coronary calcium score which was 356 which was 88th percentile.  He has no symptoms.  He does move furniture which he did recently but is not exercising routinely. The patient denies any new symptoms such as chest discomfort, neck or arm discomfort. There has been no new shortness of breath, PND or orthopnea. There have been no reported palpitations, presyncope or syncope.    Past Medical History:  Diagnosis Date  . Cardiac disease    FAMILY HX  . Dyslipidemia   . Elevated coronary artery calcium score     Past Surgical History:  Procedure Laterality Date  . VASECTOMY       Current Outpatient Medications  Medication Sig Dispense Refill  . methylphenidate (RITALIN) 10 MG tablet Take 10 mg by mouth daily as needed.    . Multiple Vitamins-Minerals (MULTIVITAMIN WITH MINERALS) tablet Take 1 tablet by mouth daily.      . sildenafil (REVATIO) 20 MG tablet Take up to 5 pills at 1 time as needed for erections 30 tablet 5  . aspirin EC 81 MG tablet Take 1 tablet (81 mg total) by mouth daily. 90 tablet 3  . atorvastatin (LIPITOR) 40 MG tablet Take 1 tablet (40 mg total) by mouth daily. 90 tablet 3   No current facility-administered medications for this visit.    Allergies:   Codeine    Social History:  The patient  reports that he has been smoking pipe. He has been smoking about 0.00 packs per day for the past 30.00 years. He  has never used smokeless tobacco. He reports current alcohol use of about 14.0 standard drinks of alcohol per week. He reports that he does not use drugs.   Family History:  The patient's family history includes Anxiety disorder in his mother; Atrial fibrillation in his mother; Cancer in his maternal grandmother; Heart disease (age of onset: 62) in his father.    ROS:  Please see the history of present illness.   Otherwise, review of systems are positive for none.   All other systems are reviewed and negative.    PHYSICAL EXAM: VS:  BP (!) 152/80   Pulse 88   Temp 98.2 F (36.8 C)   Ht 6\' 2"  (1.88 m)   Wt 193 lb 3.2 oz (87.6 kg)   SpO2 98%   BMI 24.81 kg/m  , BMI Body mass index is 24.81 kg/m. GENERAL:  Well appearing HEENT:  Pupils equal round and reactive, fundi not visualized, oral mucosa unremarkable NECK:  No jugular venous distention, waveform within normal limits, carotid upstroke brisk and symmetric, questionable right carotid bruits, no thyromegaly LYMPHATICS:  No cervical, inguinal adenopathy LUNGS:  Clear to auscultation bilaterally BACK:  No CVA tenderness CHEST:  Unremarkable HEART:  PMI not displaced or sustained,S1 and S2 within normal limits, no S3, no S4,  no clicks, no rubs, no murmurs ABD:  Flat, positive bowel sounds normal in frequency in pitch, no bruits, no rebound, no guarding, no midline pulsatile mass, no hepatomegaly, no splenomegaly EXT:  2 plus pulses throughout, no edema, no cyanosis no clubbing SKIN:  No rashes no nodules NEURO:  Cranial nerves II through XII grossly intact, motor grossly intact throughout PSYCH:  Cognitively intact, oriented to person place and time    EKG:  EKG is not ordered today. The ekg ordered 02/22/2019 demonstrates sinus rhythm, rate 82, axis within normal limits, intervals within normal limits, no acute ST-T wave changes.   Recent Labs: 02/22/2019: ALT 16; BUN 13; Creatinine, Ser 0.96; Hemoglobin 16.0; Platelets 189;  Potassium 5.1; Sodium 141    Lipid Panel    Component Value Date/Time   CHOL 172 02/22/2019 1122   TRIG 86 02/22/2019 1122   HDL 67 02/22/2019 1122   CHOLHDL 2.6 02/22/2019 1122   CHOLHDL 2.6 09/03/2016 0935   VLDL 11 09/03/2016 0935   LDLCALC 89 02/22/2019 1122      Wt Readings from Last 3 Encounters:  04/06/19 193 lb 3.2 oz (87.6 kg)  02/22/19 193 lb 6.4 oz (87.7 kg)  05/09/18 195 lb (88.5 kg)      Other studies Reviewed: Additional studies/ records that were reviewed today include: CT. labs Review of the above records demonstrates:  Please see elsewhere in the note.     ASSESSMENT AND PLAN:  ELEVATED CORONARY CALCIUM: The patient has a significantly elevated coronary calcium score but no symptoms.  I am going to screen him with a POET (Plain Old Exercise Treadmill).  We talked about aggressive risk reduction.  I probably would do the treadmill test every few years.  I calculated his MESA score which was 11.5 and he should be taking a baby aspirin.  We talked about the Mediterranean diet.  DYSLIPIDEMIA: His target LDL is less than 70 so I will increase his Lipitor to 80 mg twice daily.  BRUIT: He is going to get carotid Dopplers.  COVID EDUCATION: She is going to get the vaccine when he gets his turn.  Current medicines are reviewed at length with the patient today.  The patient does not have concerns regarding medicines.  The following changes have been made:  As above  Labs/ tests ordered today include:   Orders Placed This Encounter  Procedures  . Exercise Tolerance Test  . VAS US CAROTID     Disposition:   FU with me in one year.      Signed, Minus Breeding, MD  04/06/2019 3:37 PM    [facility]

## 2019-04-06 ENCOUNTER — Ambulatory Visit: Payer: BC Managed Care – PPO | Admitting: Cardiology

## 2019-04-06 ENCOUNTER — Encounter: Payer: Self-pay | Admitting: Cardiology

## 2019-04-06 ENCOUNTER — Other Ambulatory Visit: Payer: Self-pay

## 2019-04-06 VITALS — BP 152/80 | HR 88 | Temp 98.2°F | Ht 74.0 in | Wt 193.2 lb

## 2019-04-06 DIAGNOSIS — E785 Hyperlipidemia, unspecified: Secondary | ICD-10-CM | POA: Diagnosis not present

## 2019-04-06 DIAGNOSIS — R931 Abnormal findings on diagnostic imaging of heart and coronary circulation: Secondary | ICD-10-CM | POA: Diagnosis not present

## 2019-04-06 DIAGNOSIS — R9431 Abnormal electrocardiogram [ECG] [EKG]: Secondary | ICD-10-CM

## 2019-04-06 DIAGNOSIS — Z7189 Other specified counseling: Secondary | ICD-10-CM

## 2019-04-06 DIAGNOSIS — R0989 Other specified symptoms and signs involving the circulatory and respiratory systems: Secondary | ICD-10-CM

## 2019-04-06 MED ORDER — ATORVASTATIN CALCIUM 40 MG PO TABS: 40.0000 mg | ORAL_TABLET | Freq: Every day | ORAL | 3 refills | Status: DC

## 2019-04-06 MED ORDER — ASPIRIN EC 81 MG PO TBEC: 81.0000 mg | DELAYED_RELEASE_TABLET | Freq: Every day | ORAL | 3 refills | Status: AC

## 2019-04-06 NOTE — Patient Instructions (Addendum)
Medication Instructions:  START ASPIRIN 81MG  DAILY INCREASE LIPITOR TO 40MG  DAILY *If you need a refill on your cardiac medications before your next appointment, please call your pharmacy*  Lab Work:  You will need a Covid Screening three days before your exercise stress test. This is a Drive Up Visit at the [facility] [address] [place]. Someone will direct you to the appropriate testing line. Stay in your car and someone will be with you shortly   Testing/Procedures: Your physician has requested that you have an exercise tolerance test. For further information please visit [address] Please also follow instruction sheet, as given.  Your physician has requested that you have a carotid duplex. This test is an ultrasound of the carotid arteries in your neck. It looks at blood flow through these arteries that supply the brain with blood. Allow one hour for this exam. There are no restrictions or special instructions.  Both tests to be done on the same day.  Follow-Up: At [facility], you and your health needs are our priority.  As part of our continuing mission to provide you with exceptional heart care, we have created designated Provider Care Teams.  These Care Teams include your primary Cardiologist (physician) and Advanced Practice Providers (APPs -  Physician Assistants and Nurse Practitioners) who all work together to provide you with the care you need, when you need it.  Your next appointment:   1 year(s)  The format for your next appointment:   In Person  Provider:   [email or web address]  Other Instructions MEDITERRANEAN DIET

## 2019-04-17 ENCOUNTER — Other Ambulatory Visit (HOSPITAL_COMMUNITY)
Admission: RE | Admit: 2019-04-17 | Discharge: 2019-04-17 | Disposition: A | Discharge status: Home / Self Care | Payer: BC Managed Care – PPO | Source: Ambulatory Visit | Attending: Cardiology | Admitting: Cardiology

## 2019-04-17 DIAGNOSIS — Z20822 Contact with and (suspected) exposure to covid-19: Secondary | ICD-10-CM | POA: Insufficient documentation

## 2019-04-17 DIAGNOSIS — Z01812 Encounter for preprocedural laboratory examination: Secondary | ICD-10-CM | POA: Insufficient documentation

## 2019-04-18 ENCOUNTER — Telehealth (HOSPITAL_COMMUNITY): Payer: Self-pay

## 2019-04-18 NOTE — Telephone Encounter (Signed)
Encounter complete. 

## 2019-04-19 LAB — NOVEL CORONAVIRUS, NAA (HOSP ORDER, SEND-OUT TO REF LAB; TAT 18-24 HRS): SARS-CoV-2, NAA: NOT DETECTED

## 2019-04-20 ENCOUNTER — Ambulatory Visit (HOSPITAL_COMMUNITY)
Admission: RE | Admit: 2019-04-20 | Discharge: 2019-04-20 | Disposition: A | Discharge status: Home / Self Care | Payer: BC Managed Care – PPO | Source: Ambulatory Visit | Attending: Cardiology | Admitting: Cardiology

## 2019-04-20 ENCOUNTER — Other Ambulatory Visit: Payer: Self-pay

## 2019-04-20 ENCOUNTER — Ambulatory Visit (HOSPITAL_BASED_OUTPATIENT_CLINIC_OR_DEPARTMENT_OTHER)
Admission: RE | Admit: 2019-04-20 | Discharge: 2019-04-20 | Disposition: A | Discharge status: Home / Self Care | Payer: BC Managed Care – PPO | Source: Ambulatory Visit | Attending: Cardiology | Admitting: Cardiology

## 2019-04-20 DIAGNOSIS — R0989 Other specified symptoms and signs involving the circulatory and respiratory systems: Secondary | ICD-10-CM | POA: Diagnosis not present

## 2019-04-20 DIAGNOSIS — R931 Abnormal findings on diagnostic imaging of heart and coronary circulation: Secondary | ICD-10-CM | POA: Insufficient documentation

## 2019-04-21 LAB — EXERCISE TOLERANCE TEST
Estimated workload: 10.1 METS
Exercise duration (min): 9 min
Exercise duration (sec): 0 s
MPHR: 161 {beats}/min
Peak HR: 160 {beats}/min
Percent HR: 99 %
RPE: 17
Rest HR: 97 {beats}/min

## 2019-04-24 ENCOUNTER — Encounter: Payer: BC Managed Care – PPO | Admitting: Family Medicine

## 2019-06-10 ENCOUNTER — Ambulatory Visit: Payer: BC Managed Care – PPO | Attending: Internal Medicine

## 2019-06-10 ENCOUNTER — Other Ambulatory Visit: Payer: Self-pay

## 2019-06-10 DIAGNOSIS — Z23 Encounter for immunization: Secondary | ICD-10-CM

## 2019-06-10 NOTE — Progress Notes (Signed)
   Covid-19 Vaccination Clinic  Name:  David Jensen    MRN: [id number] DOB: Feb 23, 1961  06/10/2019  Mr. David Jensen was observed post Covid-19 immunization for 15 minutes without incident. He was provided with Vaccine Information Sheet and instruction to access the V-Safe system.   Mr. David Jensen was instructed to call 911 with any severe reactions post vaccine: Marland Kitchen Difficulty breathing  . Swelling of face and throat  . A fast heartbeat  . A bad rash all over body  . Dizziness and weakness   Immunizations Administered    Name Date Dose VIS Date Route   Pfizer COVID-19 Vaccine 06/10/2019  1:40 PM 0.3 mL 03/10/2019 Intramuscular   Manufacturer: [employer], [employer]   Lot: [id number]   NDC: [address]-8

## 2019-07-03 ENCOUNTER — Other Ambulatory Visit: Payer: Self-pay

## 2019-07-03 ENCOUNTER — Ambulatory Visit: Payer: BC Managed Care – PPO | Admitting: Family Medicine

## 2019-07-03 ENCOUNTER — Encounter: Payer: Self-pay | Admitting: Family Medicine

## 2019-07-03 VITALS — BP 110/78 | HR 95 | Temp 96.8°F | Ht 73.5 in | Wt 187.2 lb

## 2019-07-03 DIAGNOSIS — E785 Hyperlipidemia, unspecified: Secondary | ICD-10-CM

## 2019-07-03 DIAGNOSIS — I2510 Atherosclerotic heart disease of native coronary artery without angina pectoris: Secondary | ICD-10-CM | POA: Diagnosis not present

## 2019-07-03 DIAGNOSIS — Z23 Encounter for immunization: Secondary | ICD-10-CM

## 2019-07-03 DIAGNOSIS — G479 Sleep disorder, unspecified: Secondary | ICD-10-CM

## 2019-07-03 DIAGNOSIS — F900 Attention-deficit hyperactivity disorder, predominantly inattentive type: Secondary | ICD-10-CM

## 2019-07-03 DIAGNOSIS — N529 Male erectile dysfunction, unspecified: Secondary | ICD-10-CM

## 2019-07-03 DIAGNOSIS — F17200 Nicotine dependence, unspecified, uncomplicated: Secondary | ICD-10-CM

## 2019-07-03 DIAGNOSIS — R9431 Abnormal electrocardiogram [ECG] [EKG]: Secondary | ICD-10-CM

## 2019-07-03 DIAGNOSIS — Z0000 Encounter for general adult medical examination without abnormal findings: Secondary | ICD-10-CM

## 2019-07-03 DIAGNOSIS — Z8249 Family history of ischemic heart disease and other diseases of the circulatory system: Secondary | ICD-10-CM

## 2019-07-03 DIAGNOSIS — R931 Abnormal findings on diagnostic imaging of heart and coronary circulation: Secondary | ICD-10-CM

## 2019-07-03 DIAGNOSIS — D692 Other nonthrombocytopenic purpura: Secondary | ICD-10-CM

## 2019-07-03 LAB — POCT URINALYSIS DIP (PROADVANTAGE DEVICE)
Bilirubin, UA: NEGATIVE
Blood, UA: NEGATIVE
Glucose, UA: NEGATIVE mg/dL
Ketones, POC UA: NEGATIVE mg/dL
Leukocytes, UA: NEGATIVE
Nitrite, UA: NEGATIVE
Protein Ur, POC: NEGATIVE mg/dL
Specific Gravity, Urine: 1.025
Urobilinogen, Ur: 0.2
pH, UA: 6 (ref 5.0–8.0)

## 2019-07-03 LAB — COLOGUARD: Cologuard: NEGATIVE

## 2019-07-03 NOTE — Progress Notes (Signed)
   Subjective:    Patient ID: David Jensen, male    DOB: [birth year], 59 y.o.   MRN: [id number]  HPI He is here for complete examination.  He has been seen recently by cardiology because of an elevated cardiac calcium score as well as a family history of heart disease.  The evaluation including stress test and carotid study was negative.  His Lipitor was increased to 40 mg.  He is having no difficulty with that.  He does have underlying ADD and does use his medication more on an as-needed basis.  He has had some difficulty with sleep disturbance but is using melatonin with fairly good results.  He continues to smoke a pipe and is not interested in stopping.  He is not dating anyone right now but does have sildenafil if he needs any help with ED. otherwise he has no concerns or complaints.  Family and social history as well as health maintenance and immunizations was reviewed   Review of Systems  All other systems reviewed and are negative.      Objective:   Physical Exam Alert and in no distress. Tympanic membranes and canals are normal. Pharyngeal area is normal. Neck is supple without adenopathy or thyromegaly. Cardiac exam shows a regular sinus rhythm without murmurs or gallops. Lungs are clear to auscultation. Abdominal exam shows active bowel sounds without masses or tenderness.  Purpuric lesions noted on both forearms.       Assessment & Plan:  Routine general medical examination at a health care facility - Plan: Lipid Panel, CBC with Differential, Comprehensive metabolic panel, POCT Urinalysis DIP (Proadvantage Device)  Coronary artery disease involving native heart without angina pectoris, unspecified vessel or lesion type - Plan: Lipid Panel  Erectile dysfunction, unspecified erectile dysfunction type  Family history of heart disease in male family member before age 21 - Plan: Lipid Panel, CBC with Differential, Comprehensive metabolic panel  Attention deficit hyperactivity  disorder (ADHD), predominantly inattentive type  Smoker  Need for shingles vaccine - Plan: Varicella-zoster vaccine IM (Shingrix)  Senile purpura (HCC)  Elevated coronary artery calcium score  Hyperlipidemia LDL goal <100 - Plan: Lipid Panel  Abnormal electrocardiogram  Sleep disturbance  At this point he will continue on his present medication regimen.  As stated earlier he is not interested in quitting his pipe.

## 2019-07-04 ENCOUNTER — Ambulatory Visit: Payer: BC Managed Care – PPO | Attending: Internal Medicine

## 2019-07-04 DIAGNOSIS — Z23 Encounter for immunization: Secondary | ICD-10-CM

## 2019-07-04 LAB — COMPREHENSIVE METABOLIC PANEL
ALT: 27 IU/L (ref 0–44)
AST: 31 IU/L (ref 0–40)
Albumin/Globulin Ratio: 1.8 (ref 1.2–2.2)
Albumin: 4.4 g/dL (ref 3.8–4.9)
Alkaline Phosphatase: 75 IU/L (ref 39–117)
BUN/Creatinine Ratio: 15 (ref 9–20)
BUN: 13 mg/dL (ref 6–24)
Bilirubin Total: 0.3 mg/dL (ref 0.0–1.2)
CO2: 24 mmol/L (ref 20–29)
Calcium: 9.2 mg/dL (ref 8.7–10.2)
Chloride: 104 mmol/L (ref 96–106)
Creatinine, Ser: 0.89 mg/dL (ref 0.76–1.27)
GFR calc Af Amer: 108 mL/min/{1.73_m2} (ref >59)
GFR calc non Af Amer: 94 mL/min/{1.73_m2} (ref >59)
Globulin, Total: 2.5 g/dL (ref 1.5–4.5)
Glucose: 81 mg/dL (ref 65–99)
Potassium: 5 mmol/L (ref 3.5–5.2)
Sodium: 143 mmol/L (ref 134–144)
Total Protein: 6.9 g/dL (ref 6.0–8.5)

## 2019-07-04 LAB — LIPID PANEL
Chol/HDL Ratio: 2.4 ratio (ref 0.0–5.0)
Cholesterol, Total: 145 mg/dL (ref 100–199)
HDL: 61 mg/dL (ref >39)
LDL Chol Calc (NIH): 72 mg/dL (ref 0–99)
Triglycerides: 59 mg/dL (ref 0–149)
VLDL Cholesterol Cal: 12 mg/dL (ref 5–40)

## 2019-07-04 LAB — CBC WITH DIFFERENTIAL/PLATELET
Basophils Absolute: 0.1 10*3/uL (ref 0.0–0.2)
Basos: 1 %
EOS (ABSOLUTE): 0.3 10*3/uL (ref 0.0–0.4)
Eos: 4 %
Hematocrit: 46.4 % (ref 37.5–51.0)
Hemoglobin: 16.2 g/dL (ref 13.0–17.7)
Immature Grans (Abs): 0 10*3/uL (ref 0.0–0.1)
Immature Granulocytes: 1 %
Lymphocytes Absolute: 1.5 10*3/uL (ref 0.7–3.1)
Lymphs: 25 %
MCH: 34.9 pg — ABNORMAL HIGH (ref 26.6–33.0)
MCHC: 34.9 g/dL (ref 31.5–35.7)
MCV: 100 fL — ABNORMAL HIGH (ref 79–97)
Monocytes Absolute: 0.5 10*3/uL (ref 0.1–0.9)
Monocytes: 9 %
Neutrophils Absolute: 3.6 10*3/uL (ref 1.4–7.0)
Neutrophils: 60 %
Platelets: 173 10*3/uL (ref 150–450)
RBC: 4.64 x10E6/uL (ref 4.14–5.80)
RDW: 11.8 % (ref 11.6–15.4)
WBC: 5.9 10*3/uL (ref 3.4–10.8)

## 2019-07-04 NOTE — Progress Notes (Signed)
   Covid-19 Vaccination Clinic  Name:  David Jensen    MRN: [id number] DOB: 05-26-1960  07/04/2019  Mr. [name] was observed post Covid-19 immunization for 15 minutes without incident. He was provided with Vaccine Information Sheet and instruction to access the V-Safe system.   Mr. [name] was instructed to call 911 with any severe reactions post vaccine: Marland Kitchen Difficulty breathing  . Swelling of face and throat  . A fast heartbeat  . A bad rash all over body  . Dizziness and weakness   Immunizations Administered    Name Date Dose VIS Date Route   Pfizer COVID-19 Vaccine 07/04/2019  2:20 PM 0.3 mL 03/10/2019 Intramuscular   Manufacturer: [employer], [employer]   Lot: [id number]   NDC: [address]-3

## 2019-07-06 ENCOUNTER — Telehealth: Payer: Self-pay

## 2019-07-06 NOTE — Telephone Encounter (Signed)
ERROR

## 2019-07-06 NOTE — Telephone Encounter (Signed)
LVM advising pt we have cologuard results are in. Pt was advised to call back to office.  Negative. KH

## 2019-07-11 ENCOUNTER — Other Ambulatory Visit: Payer: Self-pay

## 2019-07-11 ENCOUNTER — Ambulatory Visit: Payer: BC Managed Care – PPO | Admitting: Family Medicine

## 2019-07-11 ENCOUNTER — Encounter: Payer: Self-pay | Admitting: Family Medicine

## 2019-07-11 VITALS — BP 126/74 | HR 96 | Temp 98.9°F | Wt 192.8 lb

## 2019-07-11 DIAGNOSIS — S56911A Strain of unspecified muscles, fascia and tendons at forearm level, right arm, initial encounter: Secondary | ICD-10-CM | POA: Diagnosis not present

## 2019-07-11 NOTE — Progress Notes (Addendum)
   Subjective:    Patient ID: SIERRA BISSONETTE, male    DOB: 09-12-1960, 59 y.o.   MRN: [id number]  HPI He injured his right forearm on March 29 while playing golf.  He missed his a shot and experienced some right medial forearm and elbow pain.  He continued to play and then noted swelling later.  He was seen in an emergency room where x-rays were taken.  He did bring the report with him which shows no damage.  He does have a lot of swelling and discoloration to the right forearm.   Review of Systems     Objective:   Physical Exam Alert and in no distress.  Exam of the right medial forearm does show swelling as well as ecchymosis.  There is some tenderness to palpation over the medial epicondyle.  No palpable lesions are noted.  Pain on ulnar deviation of the wrist.       Assessment & Plan:  Forearm strain, right, initial encounter Recommended supportive care with heat for 20 minutes 3 times per day and Tylenol for discomfort.  Essentially told him to listen to his body in terms of any physical activity that makes it hurt worse.  He was comfortable with that.  I do not think PT will be needed. He then asked questions concerning PSA.  His last PSA was 0.3.  I explained that his risk of getting prostate cancer with a PSA at that level is quite low.  He was comfortable with that

## 2019-07-11 NOTE — Patient Instructions (Signed)
Heat to the area for 20 minutes 3 times per day.  Tylenol for pain.  If it hurts do not do it

## 2019-07-18 ENCOUNTER — Encounter: Payer: Self-pay | Admitting: Family Medicine

## 2019-09-06 ENCOUNTER — Other Ambulatory Visit: Payer: BC Managed Care – PPO

## 2019-09-07 ENCOUNTER — Other Ambulatory Visit (INDEPENDENT_AMBULATORY_CARE_PROVIDER_SITE_OTHER): Payer: BC Managed Care – PPO

## 2019-09-07 ENCOUNTER — Other Ambulatory Visit: Payer: Self-pay

## 2019-09-07 DIAGNOSIS — Z23 Encounter for immunization: Secondary | ICD-10-CM

## 2019-11-07 ENCOUNTER — Other Ambulatory Visit: Payer: BC Managed Care – PPO

## 2019-12-07 ENCOUNTER — Encounter: Payer: Self-pay | Admitting: Family Medicine

## 2019-12-07 ENCOUNTER — Ambulatory Visit: Payer: BC Managed Care – PPO | Admitting: Family Medicine

## 2019-12-07 ENCOUNTER — Other Ambulatory Visit: Payer: Self-pay

## 2019-12-07 VITALS — BP 120/82 | HR 114 | Temp 98.0°F | Wt 192.8 lb

## 2019-12-07 DIAGNOSIS — Z125 Encounter for screening for malignant neoplasm of prostate: Secondary | ICD-10-CM | POA: Diagnosis not present

## 2019-12-07 DIAGNOSIS — G4482 Headache associated with sexual activity: Secondary | ICD-10-CM

## 2019-12-07 DIAGNOSIS — M25562 Pain in left knee: Secondary | ICD-10-CM | POA: Diagnosis not present

## 2019-12-07 DIAGNOSIS — Z23 Encounter for immunization: Secondary | ICD-10-CM

## 2019-12-07 NOTE — Progress Notes (Signed)
   Subjective:    Patient ID: David Jensen, male    DOB: [birth year], 59 y.o.   MRN: [id number]  HPI He is here for consult concerning multiple issues.  He notes that he has had intermittent headache associated with sexual activity.  No associated blurred vision, double vision, weakness, numbness or tingling.  He does not have headaches at any other time. He is also noted some urinary frequency however no hesitancy, incomplete emptying or decrease in his stream. He also notes intermittent right knee pain especially when he goes to a standing position and then rotates especially internally.  The pain only last for several seconds.  It is not interfering with his activities of daily living.  The pain lasts only several seconds and does not interfere with his ADLs.   Review of Systems     Objective:   Physical Exam Alert and in no distress.  Right knee exam shows no effusion, point tenderness.  McMurray's testing negative.  Anterior drawer normal. Urine micro negative. Rectal exam shows the prostate to be relatively normal in size for his age with no palpable lesions.     Assessment & Plan:  Orgasmic headache  Need for shingles vaccine - Plan: Varicella-zoster vaccine IM (Shingrix)  Left knee pain, unspecified chronicity  Need for influenza vaccination - Plan: Flu Vaccine QUAD 6+ mos PF IM (Fluarix Quad PF)  Screening for prostate cancer - Plan: PSA I explained that since the headache is intermittent in nature and not associated with any other symptoms, it is a benign occurrence.  Recommended using Tylenol or an NSAID of choice prior to activity to see if this will help it go away.  Explained that there are other options available.  He was comfortable with that. I explained that the pain does not last long enough or have any other functional components to it and therefore would not do any further evaluation. I explained that his urinary symptoms seem to be relatively minimal and not  interfering with his ADLs and at this point I do not think I would pursue that any further. Over 30 minutes spent discussing all these issues with him.

## 2019-12-08 LAB — PSA: Prostate Specific Ag, Serum: 0.4 ng/mL (ref 0.0–4.0)

## 2020-01-23 ENCOUNTER — Other Ambulatory Visit: Payer: Self-pay

## 2020-01-23 ENCOUNTER — Ambulatory Visit
Admission: RE | Admit: 2020-01-23 | Discharge: 2020-01-23 | Disposition: A | Discharge status: Home / Self Care | Payer: BC Managed Care – PPO | Source: Ambulatory Visit | Attending: Family Medicine | Admitting: Family Medicine

## 2020-01-23 ENCOUNTER — Encounter: Payer: Self-pay | Admitting: Family Medicine

## 2020-01-23 ENCOUNTER — Ambulatory Visit: Payer: BC Managed Care – PPO | Admitting: Family Medicine

## 2020-01-23 VITALS — BP 136/86 | HR 95 | Temp 98.4°F | Wt 194.4 lb

## 2020-01-23 DIAGNOSIS — Z23 Encounter for immunization: Secondary | ICD-10-CM

## 2020-01-23 DIAGNOSIS — M545 Low back pain, unspecified: Secondary | ICD-10-CM

## 2020-01-23 NOTE — Patient Instructions (Signed)
Months stop the meloxicam and switch you to 2 Aleve twice per day for the next 2 weeks.  Heat to your back for 20 minutes 3 times per day Acute Back Pain, Adult Acute back pain is sudden and usually short-lived. It is often caused by an injury to the muscles and tissues in the back. The injury may result from:  A muscle or ligament getting overstretched or torn (strained). Ligaments are tissues that connect bones to each other. Lifting something improperly can cause a back strain.  Wear and tear (degeneration) of the spinal disks. Spinal disks are circular tissue that provides cushioning between the bones of the spine (vertebrae).  Twisting motions, such as while playing sports or doing yard work.  A hit to the back.  Arthritis. You may have a physical exam, lab tests, and imaging tests to find the cause of your pain. Acute back pain usually goes away with rest and home care. Follow these instructions at home: Managing pain, stiffness, and swelling  Take over-the-counter and prescription medicines only as told by your health care provider.  Your health care provider may recommend applying ice during the first 24-48 hours after your pain starts. To do this: ? Put ice in a plastic bag. ? Place a towel between your skin and the bag. ? Leave the ice on for 20 minutes, 2-3 times a day.  If directed, apply heat to the affected area as often as told by your health care provider. Use the heat source that your health care provider recommends, such as a moist heat pack or a heating pad. ? Place a towel between your skin and the heat source. ? Leave the heat on for 20-30 minutes. ? Remove the heat if your skin turns bright red. This is especially important if you are unable to feel pain, heat, or cold. You have a greater risk of getting burned. Activity   Do not stay in bed. Staying in bed for more than 1-2 days can delay your recovery.  Sit up and stand up straight. Avoid leaning forward when  you sit, or hunching over when you stand. ? If you work at a desk, sit close to it so you do not need to lean over. Keep your chin tucked in. Keep your neck drawn back, and keep your elbows bent at a right angle. Your arms should look like the letter "L." ? Sit high and close to the steering wheel when you drive. Add lower back (lumbar) support to your car seat, if needed.  Take short walks on even surfaces as soon as you are able. Try to increase the length of time you walk each day.  Do not sit, drive, or stand in one place for more than 30 minutes at a time. Sitting or standing for long periods of time can put stress on your back.  Do not drive or use heavy machinery while taking prescription pain medicine.  Use proper lifting techniques. When you bend and lift, use positions that put less stress on your back: ? [place] your knees. ? Keep the load close to your body. ? Avoid twisting.  Exercise regularly as told by your health care provider. Exercising helps your back heal faster and helps prevent back injuries by keeping muscles strong and flexible.  Work with a physical therapist to make a safe exercise program, as recommended by your health care provider. Do any exercises as told by your physical therapist. Lifestyle  Maintain a healthy weight. Extra weight  puts stress on your back and makes it difficult to have good posture.  Avoid activities or situations that make you feel anxious or stressed. Stress and anxiety increase muscle tension and can make back pain worse. Learn ways to manage anxiety and stress, such as through exercise. General instructions  Sleep on a firm mattress in a comfortable position. Try lying on your side with your knees slightly bent. If you lie on your back, put a pillow under your knees.  Follow your treatment plan as told by your health care provider. This may include: ? Cognitive or behavioral therapy. ? Acupuncture or massage therapy. ? Meditation or  yoga. Contact a health care provider if:  You have pain that is not relieved with rest or medicine.  You have increasing pain going down into your legs or buttocks.  Your pain does not improve after 2 weeks.  You have pain at night.  You lose weight without trying.  You have a fever or chills. Get help right away if:  You develop new bowel or bladder control problems.  You have unusual weakness or numbness in your arms or legs.  You develop nausea or vomiting.  You develop abdominal pain.  You feel faint. Summary  Acute back pain is sudden and usually short-lived.  Use proper lifting techniques. When you bend and lift, use positions that put less stress on your back.  Take over-the-counter and prescription medicines and apply heat or ice as directed by your health care provider. This information is not intended to replace advice given to you by your health care provider. Make sure you discuss any questions you have with your health care provider. Document Revised: 07/05/2018 Document Reviewed: 10/28/2016 Elsevier Patient Education  2020 [employer].

## 2020-01-23 NOTE — Progress Notes (Signed)
   Subjective:    Patient ID: David Jensen, male    DOB: [birth year], 59 y.o.   MRN: [id number]  HPI He complains of a 10-day history of low back pain. No history of injury or overuse. He does state that he has a previous history of low back pain and has seen chiropractors in the past. Several days after the last episode he did go to an urgent care and was given meloxicam as well as a muscle relaxer. He has been doing this for roughly 1 week with no real benefit. No numbness, tingling or weakness. Review of the record indicates he did have a PSA of 0.3 done in 2018.   Review of Systems     Objective:   Physical Exam Alert and complaining of back pain. No palpable tenderness to his back. Slight loss of lumbar lordosis. Normal lumbar motion. Negative straight leg raising. DTRs normal. Hip motion normal. He is also interested in getting the Covid booster.      Assessment & Plan:  Acute low back pain without sciatica, unspecified back pain laterality - Plan: DG Lumbar Spine Complete  Immunization, viral disease - Plan: Pfizer SARS-COV-2 Vaccine Information given concerning care of low back pain. He is to switch to Aleve, use the muscle relaxer mainly at night, heat and stretching exercises. If he continues have difficulty, physical therapy will be consulted and if continued difficulty possibly getting an MRI. He was comfortable with that approach. Discussed Covid booster. Plain that he technically did not qualify but he possible side effects of this.

## 2020-02-26 ENCOUNTER — Encounter: Payer: Self-pay | Admitting: Family Medicine

## 2020-02-27 ENCOUNTER — Other Ambulatory Visit: Payer: Self-pay

## 2020-02-27 ENCOUNTER — Encounter: Payer: Self-pay | Admitting: Family Medicine

## 2020-02-27 ENCOUNTER — Ambulatory Visit: Payer: BC Managed Care – PPO | Admitting: Family Medicine

## 2020-02-27 VITALS — BP 130/92 | HR 104 | Temp 98.4°F | Wt 194.6 lb

## 2020-02-27 DIAGNOSIS — M545 Low back pain, unspecified: Secondary | ICD-10-CM

## 2020-02-27 DIAGNOSIS — Z716 Tobacco abuse counseling: Secondary | ICD-10-CM

## 2020-02-27 NOTE — Progress Notes (Signed)
   Subjective:    Patient ID: David Jensen, male    DOB: December 28, 1960, 59 y.o.   MRN: [id number]  HPI He is here for consult concerning smoking cessation. He does smoke a pipe and has no real desire to quit smoking. He does not inhale and recognizes the damage from continuing to smoke in regard to risk for cancer of various kinds including oral cancers. He also states that his back pain has gotten better but he would like to be referred for physical therapy for better long-term response.   Review of Systems     Objective:   Physical Exam Alert and in no distress otherwise not examined       Assessment & Plan:  Encounter for smoking cessation counseling  Acute low back pain without sciatica, unspecified back pain laterality He will be referred to a facility in [place] which is closer to home. No intervention needed in his smoking as he is not ready or willing to quit.

## 2020-04-03 ENCOUNTER — Encounter: Payer: Self-pay | Admitting: Physical Therapy

## 2020-04-03 ENCOUNTER — Other Ambulatory Visit: Payer: Self-pay

## 2020-04-03 ENCOUNTER — Ambulatory Visit: Payer: BC Managed Care – PPO | Attending: Family Medicine | Admitting: Physical Therapy

## 2020-04-03 DIAGNOSIS — M545 Low back pain, unspecified: Secondary | ICD-10-CM | POA: Insufficient documentation

## 2020-04-03 NOTE — Addendum Note (Signed)
Addended by: Norton Blizzard R on: 04/03/2020 06:22 PM   Modules accepted: Orders

## 2020-04-03 NOTE — Therapy (Signed)
[facility] PHYSICAL AND SPOR[facility] MEDICINE [address], [place], [address] Phone: [phone]   Fax:  [phone]  Physical Therapy Evaluation  Patient Details  Name: David Jensen MRN: [id number] Date of Birth: 1960-08-21 Referring Provider (PT): Ronnald Nian, MD ([facility])   Encounter Date: 04/03/2020   PT End of Session - 04/03/20 1750    Visit Number 1    Number of Visits 24    Date for PT Re-Evaluation 06/26/20    Authorization Type [employer] reporting period from 04/03/2020    Progress Note Due on Visit 10    PT Start Time 1635    PT Stop Time 1725    PT Time Calculation (min) 50 min    Activity Tolerance Patient tolerated treatment well;No increased pain    Behavior During Therapy WFL for tasks assessed/performed           Past Medical History:  Diagnosis Date  . Cardiac disease    FAMILY HX  . Dyslipidemia   . Elevated coronary artery calcium score     Past Surgical History:  Procedure Laterality Date  . VASECTOMY      There were no vitals filed for this visit.    Subjective Assessment - 04/03/20 1643    Subjective Patient states he is here at PT after having lower back pain and it not getting better through chiropractic and medications. It was bad enough that he went over to [facility] at some point. He had several people sing the praises of PT to help exercise correctly to keep his back healthier. He has had back pain off and on for about 30 years. Previously he has had episode when he "did something stupid." This time he had an episode for no apparent reason and his usual chiropractic did not help and the pain was severe and prolonged so he continued to seek out care and was referred to PT. He thinks it started about 2 weeks prior to when he had the xrays, about 01/11/2020. His pain has been getting better and he has not had much trouble with it for the last couple of weeks which seemed  to coincide with when he got the Christmas decorations up. Pain was mostly in the mid low back, with some a little more left to center. He has had prior episodes of left sciatica, but not this time. He did some Chiropractic and medications to treat it and trying to do some exercises. He has never had injections or surgery in his back. He is currently avoiding picking things up, getting on ladders.    Pertinent History Patient is a 60 y.o. male who presents to outpatient physical therapy with a referral for medical diagnosis  acute low back pain without sciatica, unspecified back pain laterality. This patient's chief complaints consist of episode of acute central/left low back pain in a setting of recurrent episodes over 30 years leading to the following functional deficits: difficulty with usual activities such as showering (washing legs), bending over, lifting legs up, lifting, work around the house, dressing, ADLs, IADLs, sleeping, prolonged sitting, getting up after sitting, changing positions. Relevant past medical history and comorbidities include ADHD, every day smoker, risk factors for cardiovascular disease including dyslipidemia, elevated coronary artery calcium score, family history of CAD.Patient denies hx of cancer, stroke, seizures, lung problem, major cardiac events, diabetes, unexplained weight loss, changes in bowel or bladder problems, new onset stumbling or dropping things, spinal surgery.  Limitations Sitting;House hold activities;Lifting   Functional Limitations: difficulty with showering (washing legs), bending over, lifting legs up, lifting, work around the house, dressing, ADLs, IADLs, sleeping, prolonged sitting, getting up after sitting, changing positions.   Diagnostic tests Lumbar radiograph 01/23/2020: "IMPRESSION:  1. Moderate to marked severity multilevel degenerative changes." (see chart for full report)    Patient Stated Goals to learn how to exercise "correctly" to have a  healthier back    Currently in Pain? No/denies    Pain Score 0-No pain   W: 4/10; B: 0/10   Pain Location Back    Pain Orientation Left;Lower;Mid    Pain Descriptors / Marine scientist;Sharp    Pain Type Acute pain    Pain Radiating Towards left low back. Prior history of left sided sciatica    Pain Onset More than a month ago    Pain Frequency Intermittent    Aggravating Factors  bending over, lifting legs up, lifting, work around the house, dressing, prolonged sitting, getting up after sitting, changing positions.    Pain Relieving Factors NSAIDs, ice, heat    Effect of Pain on Daily Activities Functional Limitations: difficulty with showering (washing legs), bending over, lifting legs up, lifting, work around the house, dressing, ADLs, IADLs, sleeping, prolonged sitting, getting up after sitting, changing positions.              [facility] PT Assessment - 04/03/20 0001      Assessment   Medical Diagnosis acute low back pain without sciatica, unspecified back pain laterality    Referring Provider (PT) Ronnald Nian, MD ([facility])    Onset Date/Surgical Date 01/11/20    Next MD Visit next physical may be in spring    Prior Therapy none for this problem prior to current episode of care      Precautions   Precautions None      Restrictions   Weight Bearing Restrictions No      Balance Screen   Has the patient fallen in the past 6 months No    Has the patient had a decrease in activity level because of a fear of falling?  No    Is the patient reluctant to leave their home because of a fear of falling?  No      Home Environment   Living Environment --   no concerns about getting around home     Prior Function   Level of Independence Independent    Vocation Full time employment    Scientist, physiological    Leisure play golf      Cognition   Overall Cognitive Status Within Functional Limits for tasks assessed       Observation/Other Assessments   Focus on Therapeutic Outcomes (FOTO)  79             OBJECTIVE  OBSERVATION/INSPECTION Posture: forward head, rounded shoulders, slumped in sitting.  Marland Kitchen Posture: slim build, minimal lumbar lordosis . Tremor: none . Muscle bulk: no gross abnormality  . Bed mobility: supine <> sit and rolling WFL . Transfers: sit <> stand WFL . Gait: grossly WFL for household and short community ambulation. More detailed gait analysis deferred to later date as needed.   NEUROLOGICAL  Upper Motor Neuron Screen Hoffman's, and Clonus (ankle) negative bilaterally Dermatomes  . L2-S2 appears equal and intact to light touch. Myotomes . L2-S2 appears intact Deep Tendon Reflexes R/L  . 2+/2+ Quadriceps reflex (L4) Neurodynamic Tests   . B  SLR negative for concordant sign. Mild pulling in back of left thigh released by plantar flexion when testing L LE.    SPINE MOTION Lumbar Spine AROM *Indicates pain Flexion: = distal tibias, pull in back, rounded low back Extension: = 50% Rotation: WFL bilaterally Side Flexion: ~ 50% bilaterally, no pain  PERIPHERAL JOINT MOTION (in degrees) Passive Range of Motion (PROM) Comments: B LE WFL except L hip IR overpressure at 90 degrees flexion with discomfort at groin.  Noted for tight hamstrings bilaterally.   MUSCLE PERFORMANCE (MMT):  Comments: B LE 5/5 except hip adduction left 3/5 and right 4/5.   REPEATED MOTIONS TESTING: - MDT lumbar extension in prone x 10, no effect. Very stiff through the low back.   SPECIAL TESTS: FABER: B negative.  ACCESSORY MOTION:  - Hypomobile to CPA along mid thoracic through lumbar spine, no reproduction of pain  PALPATION: - Palpation no reproduction of pain in bilateral lumbar paraspinals, posterior hip/deep glute region, glute med  FUNCTIONAL/BALANCE TESTS: - squat: keeps back flat, heels come off, deep squat buttocks to heels. - 10# box lift floor to waist: patient states "how I  should do it": feet together in front of box with rounded back and knee dominant. "How I probably usually do it": feet together in front of box, hip dominant, back rounded. No pain either way.  - reverse curl isometric hold: >60 seconds.   EDUCATION/COGNITION: Patient is alert and oriented X 4.  Objective measurements completed on examination: See above findings.     TREATMENT:   Therapeutic exercise: to centralize symptoms and improve ROM, strength, muscular endurance, and activity tolerance required for successful completion of functional activities.  - hooklying low trunk rotation x 10 each side - hooklying abdominal brace: attempted dead bug variations. Difficulty with coordination and quick fatigue with full dead bug. Better with LE only heel taps.  - hooklying bridge x 10 - standing hip hinge x 10 - standing scapular row with 15# cable x 10 - standing pallof press, 1x10 each side, 5# cable each side.  - Education on diagnosis, prognosis, POC, anatomy and physiology of current condition.  - Education on HEP including handout   Pt required multimodal cuing for proper technique and to facilitate improved neuromuscular control, strength, range of motion, and functional ability resulting in improved performance and form.  Patient required cuing for improved form and would benefit from further practice of most activities prior to performing at home. Selected the exercises for HEP that he was able to perform with good form.   HOME EXERCISE PROGRAM Access Code: [email or web address] URL: [email or web address] Date: 04/03/2020 Prepared by: Norton Blizzard  Exercises Supine Lower Trunk Rotation - 1 x daily - 20 reps Bridge - 1 x daily - 3 sets - 10 reps      PT Education - 04/03/20 1749    Education Details Exercise purpose/form. Self management techniques. Education on diagnosis, prognosis, POC, anatomy and physiology of current condition Education on HEP including handout     Person(s) Educated Patient    Methods Explanation;Demonstration;Tactile cues;Verbal cues;Handout    Comprehension Need further instruction;Verbalized understanding;Returned demonstration;Verbal cues required;Tactile cues required            PT Short Term Goals - 04/03/20 1805      PT SHORT TERM GOAL #1   Title Be independent with initial home exercise program for self-management of symptoms.    Baseline Initial HEP provided at IE (04/03/2020);    Time 2    Period  Weeks    Status New    Target Date 04/17/20             PT Long Term Goals - 04/03/20 1806      PT LONG TERM GOAL #1   Title Be independent with a long-term home exercise program for self-management of symptoms.    Baseline Initial HEP provided at IE (04/03/2020);    Time 12    Period Weeks    Status New   TARGET DATE FOR ALL LONG TERM GOALS: 06/26/2020     PT LONG TERM GOAL #2   Title Demonstrate improved FOTO score to equal or greater than 84 by visit #8 to demonstrate improvement in overall condition and self-reported functional ability.    Baseline 79 (04/03/2020);    Time 12    Period Weeks    Status New      PT LONG TERM GOAL #3   Title Be able to demonstrate floor to waist lift of equal or greater than 25# box with proper form without limitation due to current condition in order to improve ability to lift during usual activities.    Baseline 10# box with rounded back (04/03/2020);    Time 12    Period Weeks    Status New      PT LONG TERM GOAL #4   Title Complete community, work and/or recreational activities without limitation due to current condition.    Baseline Functional Limitations: difficulty with showering (washing legs), bending over, lifting legs up, lifting, work around the house, dressing, ADLs, IADLs, sleeping, prolonged sitting, getting up after sitting, changing positions (04/03/2020);      PT LONG TERM GOAL #5   Title Reduce pain with functional activities to equal or less than 1/10 to allow  patient to complete usual activities including ADLs, IADLs, and social engagement with less difficulty.    Baseline up to 4/10 (04/03/2020);    Time 12    Period Weeks    Status New                  Plan - 04/03/20 1815    Clinical Impression Statement Patient is a 60 y.o. male referred to outpatient physical therapy with a medical diagnosis of acute low back pain without sciatica, unspecified back pain laterality who presents with signs and symptoms consistent with resolving episode of acute low back pain with no LE radiation  in the setting of chronic recurrent episodes of low back pain. Patient presents with significant pain, joint stiffness, motor control, activity tolerance, ROM, muscle performance (strength/power/endurance) impairments that are limiting ability to complete his usual activities including showering (washing legs), bending over, lifting legs up, lifting, work around the house, dressing, ADLs, IADLs, sleeping, prolonged sitting, getting up after sitting, changing positions without difficulty. Patient will benefit from skilled physical therapy intervention to address current body structure impairments and activity limitations to improve function and work towards goals set in current POC in order to return to prior level of function or maximal functional improvement.    Personal Factors and Comorbidities Age;Comorbidity 3+;Time since onset of injury/illness/exacerbation;Past/Current Experience;Fitness    Comorbidities Relevant past medical history and comorbidities include ADHD, every day smoker, risk factors for cardiovascular disease including dyslipidemia, elevated coronary artery calcium score, family history of CAD.    Examination-Activity Limitations Hygiene/Grooming;Bathing;Squat;Transfers;Dressing;Bend   Functional Limitations: difficulty with showering (washing legs), bending over, lifting legs up, lifting, work around the house, dressing, ADLs, IADLs, sleeping, prolonged  sitting, getting up after  sitting, changing positions.   Examination-Participation Restrictions Community Activity;Interpersonal Relationship   work around the house   Stability/Clinical Decision Making Stable/Uncomplicated    Clinical Decision Making Low    Rehab Potential Good    PT Frequency 2x / week    PT Duration 12 weeks   as needed   PT Treatment/Interventions ADLs/Self Care Home Management;Cryotherapy;Moist Heat;Electrical Stimulation;Therapeutic activities;Therapeutic exercise;Neuromuscular re-education;Patient/family education;Manual techniques;Dry needling;Spinal Manipulations;Joint Manipulations    PT Next Visit Plan motor control, mobility, and functional strengthening. manual therapy as needed    PT Home Exercise Plan Medbridge Access Code: [email or web address]    Consulted and Agree with Plan of Care Patient           Patient will benefit from skilled therapeutic intervention in order to improve the following deficits and impairments:  Improper body mechanics,Pain,Decreased coordination,Decreased mobility,Decreased activity tolerance,Decreased endurance,Decreased range of motion,Decreased strength,Hypomobility,Impaired perceived functional ability,Impaired flexibility  Visit Diagnosis: Low back pain without sciatica, unspecified back pain laterality, unspecified chronicity     Problem List Patient Active Problem List   Diagnosis Date Noted  . Elevated coronary artery calcium score 04/05/2019  . Erectile dysfunction 02/22/2019  . Senile purpura ([place]) 02/22/2019  . Smoker 11/24/2013  . Abnormal electrocardiogram 03/19/2011  . ADHD (attention deficit hyperactivity disorder) 02/11/2011  . Family history of heart disease in male family member before age 47 02/11/2011  . Hyperlipidemia LDL goal <100 02/11/2011    Everlean Alstrom. Graylon Good, PT, DPT 04/03/20, 6:21 PM  [place] [facility] [address], [place], [address] Phone:  [phone]   Fax:  [phone]  Name: David Jensen MRN: [id number] Date of Birth: 1960/05/21

## 2020-04-08 ENCOUNTER — Ambulatory Visit: Payer: BC Managed Care – PPO | Admitting: Physical Therapy

## 2020-04-08 ENCOUNTER — Encounter: Payer: Self-pay | Admitting: Physical Therapy

## 2020-04-08 ENCOUNTER — Other Ambulatory Visit: Payer: Self-pay

## 2020-04-08 DIAGNOSIS — M545 Low back pain, unspecified: Secondary | ICD-10-CM

## 2020-04-08 NOTE — Therapy (Signed)
[facility] MED[facility]INE [address], [place], [address] Phone: [phone]   Fax:  [phone]  Physical Therapy Treatment  Patient Details  Name: David Jensen MRN: [id number] Date of Birth: December 25, 1960 Referring Provider (PT): Ronnald Nian, MD ([facility])   Encounter Date: 04/08/2020   PT End of Session - 04/08/20 1814    Visit Number 2    Number of Visits 24    Date for PT Re-Evaluation 06/26/20    Authorization Type [employer] reporting period from 04/03/2020    Progress Note Due on Visit 10    PT Start Time 1350    PT Stop Time 1430    PT Time Calculation (min) 40 min    Activity Tolerance Patient tolerated treatment well;No increased pain    Behavior During Therapy WFL for tasks assessed/performed           Past Medical History:  Diagnosis Date  . Cardiac disease    FAMILY HX  . Dyslipidemia   . Elevated coronary artery calcium score     Past Surgical History:  Procedure Laterality Date  . VASECTOMY      There were no vitals filed for this visit.   Subjective Assessment - 04/08/20 1405    Subjective Patient reports he is feeling okay today with no pain. Has some questions about one of his exercises. Took down some Christmas decorations and got a few steps up the ladder cautiously. No pain affer last treatment session.    Pertinent History Patient is a 60 y.o. male who presents to outpatient physical therapy with a referral for medical diagnosis  acute low back pain without sciatica, unspecified back pain laterality. This patient's chief complaints consist of episode of acute central/left low back pain in a setting of recurrent episodes over 30 years leading to the following functional deficits: difficulty with usual activities such as showering (washing legs), bending over, lifting legs up, lifting, work around the house, dressing, ADLs, IADLs, sleeping, prolonged sitting,  getting up after sitting, changing positions. Relevant past medical history and comorbidities include ADHD, every day smoker, risk factors for cardiovascular disease including dyslipidemia, elevated coronary artery calcium score, family history of CAD.Patient denies hx of cancer, stroke, seizures, lung problem, major cardiac events, diabetes, unexplained weight loss, changes in bowel or bladder problems, new onset stumbling or dropping things, spinal surgery.    Limitations Sitting;House hold activities;Lifting   Functional Limitations: difficulty with showering (washing legs), bending over, lifting legs up, lifting, work around the house, dressing, ADLs, IADLs, sleeping, prolonged sitting, getting up after sitting, changing positions.   Diagnostic tests Lumbar radiograph 01/23/2020: "IMPRESSION:  1. Moderate to marked severity multilevel degenerative changes." (see chart for full report)    Patient Stated Goals to learn how to exercise "correctly" to have a healthier back    Currently in Pain? No/denies    Pain Onset More than a month ago            TREATMENT:   Therapeutic exercise:to centralize symptoms and improve ROM, strength, muscular endurance, and activity tolerance required for successful completion of functional activities.  - NuStep level 3 using bilateral upper and lower extremities. Seat/handle setting 13. For improved extremity mobility, muscular endurance, and activity tolerance; and to induce the analgesic effect of aerobic exercise, stimulate improved joint nutrition, and prepare body structures and systems for following interventions. x 6 minutes. Average SPM = 94.  - hooklying low trunk  rotation x 20 each side - hooklying bridge x30 - hooklying single leg bridge, 3x10 each side - supine hamstring stretch, both knees extended, 3x30 seconds each side.  - quadruped bird dog, 2x10 each side. A little wobbly.  - quadruped cat-cow x 5 to find lumbar neutral - quadruped rock backs  x 20 to help with internal feedback of flat back with hip hinge.  - standing hip hinge with stick behind back x 20 - Education on HEP including handout   Pt required multimodal cuing for proper technique and to facilitate improved neuromuscular control, strength, range of motion, and functional ability resulting in improved performance and form.  HOME EXERCISE PROGRAM Access Code: [email or web address] URL: [email or web address] Date: 04/08/2020 Prepared by: Norton Blizzard  Exercises Supine Lower Trunk Rotation - 1 x daily - 20 reps Single Leg Bridge - 1 x daily - 3 sets - 10 reps Hamstring stretch (with strap) - 4 x weekly - 3 reps - 30 seconds hold   PT Education - 04/08/20 1814    Education Details Exercise purpose/form. Self management techniques    Person(s) Educated Patient    Methods Explanation;Demonstration;Tactile cues;Verbal cues;Handout    Comprehension Verbalized understanding;Returned demonstration;Verbal cues required;Tactile cues required;Need further instruction            PT Short Term Goals - 04/03/20 1805      PT SHORT TERM GOAL #1   Title Be independent with initial home exercise program for self-management of symptoms.    Baseline Initial HEP provided at IE (04/03/2020);    Time 2    Period Weeks    Status New    Target Date 04/17/20             PT Long Term Goals - 04/03/20 1806      PT LONG TERM GOAL #1   Title Be independent with a long-term home exercise program for self-management of symptoms.    Baseline Initial HEP provided at IE (04/03/2020);    Time 12    Period Weeks    Status New   TARGET DATE FOR ALL LONG TERM GOALS: 06/26/2020     PT LONG TERM GOAL #2   Title Demonstrate improved FOTO score to equal or greater than 84 by visit #8 to demonstrate improvement in overall condition and self-reported functional ability.    Baseline 79 (04/03/2020);    Time 12    Period Weeks    Status New      PT LONG TERM GOAL #3   Title Be able to  demonstrate floor to waist lift of equal or greater than 25# box with proper form without limitation due to current condition in order to improve ability to lift during usual activities.    Baseline 10# box with rounded back (04/03/2020);    Time 12    Period Weeks    Status New      PT LONG TERM GOAL #4   Title Complete community, work and/or recreational activities without limitation due to current condition.    Baseline Functional Limitations: difficulty with showering (washing legs), bending over, lifting legs up, lifting, work around the house, dressing, ADLs, IADLs, sleeping, prolonged sitting, getting up after sitting, changing positions (04/03/2020);      PT LONG TERM GOAL #5   Title Reduce pain with functional activities to equal or less than 1/10 to allow patient to complete usual activities including ADLs, IADLs, and social engagement with less difficulty.    Baseline up to 4/10 (04/03/2020);  Time 12    Period Weeks    Status New                 Plan - 04/08/20 1817    Clinical Impression Statement Patient tolerated treatment well and was able to advance to more difficulty exercises without pain. Found bird dog and lumbopelvic control challenging and would benefit from further practice in the future. Patient would benefit from continued management of limiting condition by skilled physical therapist to address remaining impairments and functional limitations to work towards stated goals and return to PLOF or maximal functional independence.    Personal Factors and Comorbidities Age;Comorbidity 3+;Time since onset of injury/illness/exacerbation;Past/Current Experience;Fitness    Comorbidities Relevant past medical history and comorbidities include ADHD, every day smoker, risk factors for cardiovascular disease including dyslipidemia, elevated coronary artery calcium score, family history of CAD.    Examination-Activity Limitations  Hygiene/Grooming;Bathing;Squat;Transfers;Dressing;Bend   Functional Limitations: difficulty with showering (washing legs), bending over, lifting legs up, lifting, work around the house, dressing, ADLs, IADLs, sleeping, prolonged sitting, getting up after sitting, changing positions.   Examination-Participation Restrictions Community Activity;Interpersonal Relationship   work around the house   Stability/Clinical Decision Making Stable/Uncomplicated    Rehab Potential Good    PT Frequency 2x / week    PT Duration 12 weeks   as needed   PT Treatment/Interventions ADLs/Self Care Home Management;Cryotherapy;Moist Heat;Electrical Stimulation;Therapeutic activities;Therapeutic exercise;Neuromuscular re-education;Patient/family education;Manual techniques;Dry needling;Spinal Manipulations;Joint Manipulations    PT Next Visit Plan motor control, mobility, and functional strengthening. manual therapy as needed    PT Home Exercise Plan Medbridge Access Code: [email or web address]    Consulted and Agree with Plan of Care Patient           Patient will benefit from skilled therapeutic intervention in order to improve the following deficits and impairments:  Improper body mechanics,Pain,Decreased coordination,Decreased mobility,Decreased activity tolerance,Decreased endurance,Decreased range of motion,Decreased strength,Hypomobility,Impaired perceived functional ability,Impaired flexibility  Visit Diagnosis: Low back pain without sciatica, unspecified back pain laterality, unspecified chronicity     Problem List Patient Active Problem List   Diagnosis Date Noted  . Elevated coronary artery calcium score 04/05/2019  . Erectile dysfunction 02/22/2019  . Senile purpura (HCC) 02/22/2019  . Smoker 11/24/2013  . Abnormal electrocardiogram 03/19/2011  . ADHD (attention deficit hyperactivity disorder) 02/11/2011  . Family history of heart disease in male family member before age 76 02/11/2011  . Hyperlipidemia LDL  goal <100 02/11/2011    Luretha Murphy. Ilsa Iha, PT, DPT 04/08/20, 6:18 PM  [facility] PH[facility] AND SPORTS MEDICINE [address] [place], [address] Phone: [phone]   Fax:  [phone]  Name: David Jensen MRN: [id number] Date of Birth: Mar 22, 1961

## 2020-04-10 ENCOUNTER — Ambulatory Visit: Payer: BC Managed Care – PPO | Admitting: Physical Therapy

## 2020-04-10 ENCOUNTER — Other Ambulatory Visit: Payer: Self-pay

## 2020-04-10 ENCOUNTER — Encounter: Payer: Self-pay | Admitting: Physical Therapy

## 2020-04-10 DIAGNOSIS — M545 Low back pain, unspecified: Secondary | ICD-10-CM

## 2020-04-10 NOTE — Therapy (Signed)
[facility] [address], [place], [address] Phone: [phone]   Fax:  [phone]  Physical Therapy Treatment  Patient Details  Name: David Jensen MRN: [id number] Date of Birth: 08-Oct-1960 Referring Provider (PT): Ronnald Nian, MD ([facility])   Encounter Date: 04/10/2020   PT End of Session - 04/10/20 1641    Visit Number 3    Number of Visits 24    Date for PT Re-Evaluation 06/26/20    Authorization Type [employer] reporting period from 04/03/2020    Progress Note Due on Visit 10    PT Start Time 1637    PT Stop Time 1715    PT Time Calculation (min) 38 min    Activity Tolerance Patient tolerated treatment well;No increased pain    Behavior During Therapy WFL for tasks assessed/performed           Past Medical History:  Diagnosis Date  . Cardiac disease    FAMILY HX  . Dyslipidemia   . Elevated coronary artery calcium score     Past Surgical History:  Procedure Laterality Date  . VASECTOMY      There were no vitals filed for this visit.   Subjective Assessment - 04/10/20 1639    Subjective Patient reports he feels well with no pain upon arrival. Had no pain or soreness following last treatment session. He has been very busy since last session and did not get a chance to do any of his exercise.    Pertinent History Patient is a 60 y.o. male who presents to outpatient physical therapy with a referral for medical diagnosis  acute low back pain without sciatica, unspecified back pain laterality. This patient's chief complaints consist of episode of acute central/left low back pain in a setting of recurrent episodes over 30 years leading to the following functional deficits: difficulty with usual activities such as showering (washing legs), bending over, lifting legs up, lifting, work around the house, dressing, ADLs, IADLs, sleeping, prolonged sitting, getting up after  sitting, changing positions. Relevant past medical history and comorbidities include ADHD, every day smoker, risk factors for cardiovascular disease including dyslipidemia, elevated coronary artery calcium score, family history of CAD.Patient denies hx of cancer, stroke, seizures, lung problem, major cardiac events, diabetes, unexplained weight loss, changes in bowel or bladder problems, new onset stumbling or dropping things, spinal surgery.    Limitations Sitting;House hold activities;Lifting   Functional Limitations: difficulty with showering (washing legs), bending over, lifting legs up, lifting, work around the house, dressing, ADLs, IADLs, sleeping, prolonged sitting, getting up after sitting, changing positions.   Diagnostic tests Lumbar radiograph 01/23/2020: "IMPRESSION:  1. Moderate to marked severity multilevel degenerative changes." (see chart for full report)    Patient Stated Goals to learn how to exercise "correctly" to have a healthier back    Currently in Pain? No/denies    Pain Onset More than a month ago           TREATMENT:  Therapeutic exercise:to centralize symptoms and improve ROM, strength, muscular endurance, and activity tolerance required for successful completion of functional activities. - NuStep level 3 using bilateral upper and lower extremities. Seat/handle setting 13. For improved extremity mobility, muscular endurance, and activity tolerance; and to induce the analgesic effect of aerobic exercise, stimulate improved joint nutrition, and prepare body structures and systems for following interventions. x 6 minutes. Average SPM = 98.  - hooklyinglow trunk rotation x 20  each side, with green theraball under legs x20 each direction.  - hooklying single leg bridge, 3x10 each side - supine hamstring stretch, both knees extended, 3x30 seconds each side.  - quadruped bird dog, 3x10 each side. A little wobbly. - quadruped cat-cow x 10 to find lumbar neutral -  quadruped rock backs x 20 to help with internal feedback of flat back with hip hinge.  - standing hip hinge with stick behind back x 20 - standing hip hinge with no stick x 15 - mini-squat to elevated table (27") tap, x15 AROM, 3x10 while holding 10# DB at goblet squat position.  - Pt required multimodal cuing for proper technique and to facilitate improved neuromuscular control, strength, range of motion, and functional ability resulting in improved performance and form.  HOME EXERCISE PROGRAM Access Code: [email or web address] URL: [email or web address] Date: 04/08/2020 Prepared by: Norton Blizzard  Exercises Supine Lower Trunk Rotation - 1 x daily - 20 reps Single Leg Bridge - 1 x daily - 3 sets - 10 reps Hamstring stretch (with strap) - 4 x weekly - 3 reps - 30 seconds hold     PT Education - 04/10/20 1640    Education Details Exercise purpose/form. Self management techniques    Person(s) Educated Patient    Methods Explanation;Demonstration;Tactile cues;Verbal cues    Comprehension Verbalized understanding;Returned demonstration;Verbal cues required;Tactile cues required;Need further instruction            PT Short Term Goals - 04/03/20 1805      PT SHORT TERM GOAL #1   Title Be independent with initial home exercise program for self-management of symptoms.    Baseline Initial HEP provided at IE (04/03/2020);    Time 2    Period Weeks    Status New    Target Date 04/17/20             PT Long Term Goals - 04/03/20 1806      PT LONG TERM GOAL #1   Title Be independent with a long-term home exercise program for self-management of symptoms.    Baseline Initial HEP provided at IE (04/03/2020);    Time 12    Period Weeks    Status New   TARGET DATE FOR ALL LONG TERM GOALS: 06/26/2020     PT LONG TERM GOAL #2   Title Demonstrate improved FOTO score to equal or greater than 84 by visit #8 to demonstrate improvement in overall condition and self-reported functional  ability.    Baseline 79 (04/03/2020);    Time 12    Period Weeks    Status New      PT LONG TERM GOAL #3   Title Be able to demonstrate floor to waist lift of equal or greater than 25# box with proper form without limitation due to current condition in order to improve ability to lift during usual activities.    Baseline 10# box with rounded back (04/03/2020);    Time 12    Period Weeks    Status New      PT LONG TERM GOAL #4   Title Complete community, work and/or recreational activities without limitation due to current condition.    Baseline Functional Limitations: difficulty with showering (washing legs), bending over, lifting legs up, lifting, work around the house, dressing, ADLs, IADLs, sleeping, prolonged sitting, getting up after sitting, changing positions (04/03/2020);      PT LONG TERM GOAL #5   Title Reduce pain with functional activities to equal or less than 1/10 to  allow patient to complete usual activities including ADLs, IADLs, and social engagement with less difficulty.    Baseline up to 4/10 (04/03/2020);    Time 12    Period Weeks    Status New                 Plan - 04/10/20 1643    Clinical Impression Statement Patient tolerated treatment well overall. Able to progress hip hinge pattern and motor control exercises. Patient would benefit from continued management of limiting condition by skilled physical therapist to address remaining impairments and functional limitations to work towards stated goals and return to PLOF or maximal functional independence.    Personal Factors and Comorbidities Age;Comorbidity 3+;Time since onset of injury/illness/exacerbation;Past/Current Experience;Fitness    Comorbidities Relevant past medical history and comorbidities include ADHD, every day smoker, risk factors for cardiovascular disease including dyslipidemia, elevated coronary artery calcium score, family history of CAD.    Examination-Activity Limitations  Hygiene/Grooming;Bathing;Squat;Transfers;Dressing;Bend   Functional Limitations: difficulty with showering (washing legs), bending over, lifting legs up, lifting, work around the house, dressing, ADLs, IADLs, sleeping, prolonged sitting, getting up after sitting, changing positions.   Examination-Participation Restrictions Community Activity;Interpersonal Relationship   work around the house   Stability/Clinical Decision Making Stable/Uncomplicated    Rehab Potential Good    PT Frequency 2x / week    PT Duration 12 weeks   as needed   PT Treatment/Interventions ADLs/Self Care Home Management;Cryotherapy;Moist Heat;Electrical Stimulation;Therapeutic activities;Therapeutic exercise;Neuromuscular re-education;Patient/family education;Manual techniques;Dry needling;Spinal Manipulations;Joint Manipulations    PT Next Visit Plan motor control, mobility, and functional strengthening. manual therapy as needed    PT Home Exercise Plan Medbridge Access Code: [email or web address]    Consulted and Agree with Plan of Care Patient           Patient will benefit from skilled therapeutic intervention in order to improve the following deficits and impairments:  Improper body mechanics,Pain,Decreased coordination,Decreased mobility,Decreased activity tolerance,Decreased endurance,Decreased range of motion,Decreased strength,Hypomobility,Impaired perceived functional ability,Impaired flexibility  Visit Diagnosis: Low back pain without sciatica, unspecified back pain laterality, unspecified chronicity     Problem List Patient Active Problem List   Diagnosis Date Noted  . Elevated coronary artery calcium score 04/05/2019  . Erectile dysfunction 02/22/2019  . Senile purpura (HCC) 02/22/2019  . Smoker 11/24/2013  . Abnormal electrocardiogram 03/19/2011  . ADHD (attention deficit hyperactivity disorder) 02/11/2011  . Family history of heart disease in male family member before age 10 02/11/2011  . Hyperlipidemia LDL  goal <100 02/11/2011   Luretha Murphy. Ilsa Iha, PT, DPT 04/10/20, 5:22 PM  [facility] PHYSICAL AND SPORTS MEDICINE [address], [place], [address] Phone: [phone]   Fax:  [phone]  Name: David Jensen MRN: [id number] Date of Birth: Aug 26, 1960

## 2020-04-15 ENCOUNTER — Ambulatory Visit: Payer: BC Managed Care – PPO | Admitting: Physical Therapy

## 2020-04-17 ENCOUNTER — Ambulatory Visit: Payer: BC Managed Care – PPO | Admitting: Physical Therapy

## 2020-04-22 ENCOUNTER — Ambulatory Visit: Payer: BC Managed Care – PPO | Admitting: Physical Therapy

## 2020-04-22 ENCOUNTER — Other Ambulatory Visit: Payer: Self-pay

## 2020-04-22 DIAGNOSIS — M545 Low back pain, unspecified: Secondary | ICD-10-CM | POA: Diagnosis not present

## 2020-04-22 NOTE — Therapy (Signed)
[facility] [address], [place], [address] Phone: [phone]   Fax:  [phone]  Physical Therapy Treatment  Patient Details  Name: David Jensen MRN: [id number] Date of Birth: [birth year] Referring Provider (PT): Ronnald Nian, MD ([facility])   Encounter Date: 04/22/2020   PT End of Session - 04/22/20 1653    Visit Number 4    Number of Visits 24    Date for PT Re-Evaluation 06/26/20    Authorization Type [employer] reporting period from 04/03/2020    Progress Note Due on Visit 10    PT Start Time 1647    PT Stop Time 1733    PT Time Calculation (min) 46 min    Activity Tolerance Patient tolerated treatment well;No increased pain    Behavior During Therapy WFL for tasks assessed/performed           Past Medical History:  Diagnosis Date  . Cardiac disease    FAMILY HX  . Dyslipidemia   . Elevated coronary artery calcium score     Past Surgical History:  Procedure Laterality Date  . VASECTOMY      There were no vitals filed for this visit.   Subjective Assessment - 04/22/20 1650    Subjective Patient reports his back was acting up since last visit after carrying around his blind dog and carrying wood for a bonfire. His back was a little stiff after that but it felt better with rest. He reports no pain upon arrival. He has doing the double leg bridge and lower trunk rotation. He states these helped his back feel better when it was hurting.    Pertinent History Patient is a 60 y.o. male who presents to outpatient physical therapy with a referral for medical diagnosis  acute low back pain without sciatica, unspecified back pain laterality. This patient's chief complaints consist of episode of acute central/left low back pain in a setting of recurrent episodes over 30 years leading to the following functional deficits: difficulty with usual activities such as showering  (washing legs), bending over, lifting legs up, lifting, work around the house, dressing, ADLs, IADLs, sleeping, prolonged sitting, getting up after sitting, changing positions. Relevant past medical history and comorbidities include ADHD, every day smoker, risk factors for cardiovascular disease including dyslipidemia, elevated coronary artery calcium score, family history of CAD.Patient denies hx of cancer, stroke, seizures, lung problem, major cardiac events, diabetes, unexplained weight loss, changes in bowel or bladder problems, new onset stumbling or dropping things, spinal surgery.    Limitations Sitting;House hold activities;Lifting   Functional Limitations: difficulty with showering (washing legs), bending over, lifting legs up, lifting, work around the house, dressing, ADLs, IADLs, sleeping, prolonged sitting, getting up after sitting, changing positions.   Diagnostic tests Lumbar radiograph 01/23/2020: "IMPRESSION:  1. Moderate to marked severity multilevel degenerative changes." (see chart for full report)    Patient Stated Goals to learn how to exercise "correctly" to have a healthier back    Currently in Pain? No/denies    Pain Onset --           TREATMENT:  Therapeutic exercise:to centralize symptoms and improve ROM, strength, muscular endurance, and activity tolerance required for successful completion of functional activities. -NuStep level0-3using bilateral upper and lower extremities. Seat/handle setting 13. For improved extremity mobility, muscular endurance, and activity tolerance; and to induce the analgesic effect of aerobic exercise, stimulate improved joint nutrition, and prepare body structures  and systems for following interventions. x68minutes. Average SPM = 111.  (manual therapy - see below) - hooklyinglow trunk rotation x20 each side,  - quadruped cat-cow x 10 to find lumbar neutral - quadruped bird dog, 3x10 each side. Cone over sacrum last two sets to  challenge lumbopevlic stability.  - squat to tap on plinth, 2x10 while holding 10# DB at goblet squat position.  Feels it in low back. Cuing for full hip extension.  - dead lift with 10# DB held at end, 1x10 - standing pallof press with cable, 2x10 each side, 10#.   Manual therapy: to reduce pain and tissue tension, improve range of motion, neuromodulation, in order to promote improved ability to complete functional activities. - prone CPA grade III-IV with KE wedge along mid thoracic spine to lower lumbar spine 10-30 seconds each segment.  - MDT flexion rotation with clinician OP legs to right (discontinued due to having difficulty relaxing at edge of table).  - sidelying lumbar rotational gapping grade III-V each side  Pt required multimodal cuing for proper technique and to facilitate improved neuromuscular control, strength, range of motion, and functional ability resulting in improved performance and form.  HOME EXERCISE PROGRAM Access Code: [email or web address] URL: [email or web address] Date: 04/08/2020 Prepared by: Norton Blizzard  Exercises Supine Lower Trunk Rotation - 1 x daily - 20 reps Single Leg Bridge - 1 x daily - 3 sets - 10 reps Hamstring stretch (with strap) - 4 x weekly - 3 reps - 30 seconds hold     PT Education - 04/22/20 1653    Education Details Exercise purpose/form. Self management techniques    Person(s) Educated Patient    Methods Explanation;Demonstration;Tactile cues;Verbal cues    Comprehension Verbalized understanding;Returned demonstration;Verbal cues required;Tactile cues required;Need further instruction            PT Short Term Goals - 04/03/20 1805      PT SHORT TERM GOAL #1   Title Be independent with initial home exercise program for self-management of symptoms.    Baseline Initial HEP provided at IE (04/03/2020);    Time 2    Period Weeks    Status New    Target Date 04/17/20             PT Long Term Goals - 04/03/20 1806       PT LONG TERM GOAL #1   Title Be independent with a long-term home exercise program for self-management of symptoms.    Baseline Initial HEP provided at IE (04/03/2020);    Time 12    Period Weeks    Status New   TARGET DATE FOR ALL LONG TERM GOALS: 06/26/2020     PT LONG TERM GOAL #2   Title Demonstrate improved FOTO score to equal or greater than 84 by visit #8 to demonstrate improvement in overall condition and self-reported functional ability.    Baseline 79 (04/03/2020);    Time 12    Period Weeks    Status New      PT LONG TERM GOAL #3   Title Be able to demonstrate floor to waist lift of equal or greater than 25# box with proper form without limitation due to current condition in order to improve ability to lift during usual activities.    Baseline 10# box with rounded back (04/03/2020);    Time 12    Period Weeks    Status New      PT LONG TERM GOAL #4   Title Complete community, work  and/or recreational activities without limitation due to current condition.    Baseline Functional Limitations: difficulty with showering (washing legs), bending over, lifting legs up, lifting, work around the house, dressing, ADLs, IADLs, sleeping, prolonged sitting, getting up after sitting, changing positions (04/03/2020);      PT LONG TERM GOAL #5   Title Reduce pain with functional activities to equal or less than 1/10 to allow patient to complete usual activities including ADLs, IADLs, and social engagement with less difficulty.    Baseline up to 4/10 (04/03/2020);    Time 12    Period Weeks    Status New                 Plan - 04/22/20 1742    Clinical Impression Statement Patient continues to progress with lifting technique. Continues to not be tender with manual therapy but is very stiff throughout the spine. Improved lumbopelvic control today. Reports feeling his back muscles working during squats, dead lift, and pallof press. Patient would benefit from continued management of limiting  condition by skilled physical therapist to address remaining impairments and functional limitations to work towards stated goals and return to PLOF or maximal functional independence.    Personal Factors and Comorbidities Age;Comorbidity 3+;Time since onset of injury/illness/exacerbation;Past/Current Experience;Fitness    Comorbidities Relevant past medical history and comorbidities include ADHD, every day smoker, risk factors for cardiovascular disease including dyslipidemia, elevated coronary artery calcium score, family history of CAD.    Examination-Activity Limitations Hygiene/Grooming;Bathing;Squat;Transfers;Dressing;Bend   Functional Limitations: difficulty with showering (washing legs), bending over, lifting legs up, lifting, work around the house, dressing, ADLs, IADLs, sleeping, prolonged sitting, getting up after sitting, changing positions.   Examination-Participation Restrictions Community Activity;Interpersonal Relationship   work around the house   Stability/Clinical Decision Making Stable/Uncomplicated    Rehab Potential Good    PT Frequency 2x / week    PT Duration 12 weeks   as needed   PT Treatment/Interventions ADLs/Self Care Home Management;Cryotherapy;Moist Heat;Electrical Stimulation;Therapeutic activities;Therapeutic exercise;Neuromuscular re-education;Patient/family education;Manual techniques;Dry needling;Spinal Manipulations;Joint Manipulations    PT Next Visit Plan motor control, mobility, and functional strengthening. manual therapy as needed    PT Home Exercise Plan Medbridge Access Code: [email or web address]    Consulted and Agree with Plan of Care Patient           Patient will benefit from skilled therapeutic intervention in order to improve the following deficits and impairments:  Improper body mechanics,Pain,Decreased coordination,Decreased mobility,Decreased activity tolerance,Decreased endurance,Decreased range of motion,Decreased strength,Hypomobility,Impaired perceived  functional ability,Impaired flexibility  Visit Diagnosis: Low back pain without sciatica, unspecified back pain laterality, unspecified chronicity     Problem List Patient Active Problem List   Diagnosis Date Noted  . Elevated coronary artery calcium score 04/05/2019  . Erectile dysfunction 02/22/2019  . Senile purpura (HCC) 02/22/2019  . Smoker 11/24/2013  . Abnormal electrocardiogram 03/19/2011  . ADHD (attention deficit hyperactivity disorder) 02/11/2011  . Family history of heart disease in male family member before age 53 02/11/2011  . Hyperlipidemia LDL goal <100 02/11/2011    Luretha Murphy. Ilsa Iha, PT, DPT 04/22/20, 5:45 PM  [facility] [address] [place], [address] Phone: [phone]   Fax:  [phone]  Name: David Jensen MRN: [id number] Date of Birth: Jan 07, 1961

## 2020-04-25 ENCOUNTER — Other Ambulatory Visit: Payer: Self-pay

## 2020-04-25 ENCOUNTER — Ambulatory Visit: Payer: BC Managed Care – PPO | Admitting: Physical Therapy

## 2020-04-25 ENCOUNTER — Encounter: Payer: Self-pay | Admitting: Physical Therapy

## 2020-04-25 DIAGNOSIS — M545 Low back pain, unspecified: Secondary | ICD-10-CM | POA: Diagnosis not present

## 2020-04-25 NOTE — Therapy (Signed)
[facility] [address] [place], [address] Phone: [phone]   Fax:  [phone]  Physical Therapy Treatment  Patient Details  Name: Bookert Guzzi Terrones MRN: [id number] Date of Birth: [birth year] Referring Provider (PT): Ronnald Nian, MD ([facility])   Encounter Date: 04/25/2020   PT End of Session - 04/25/20 1708    Visit Number 5    Number of Visits 24    Date for PT Re-Evaluation 06/26/20    Authorization Type [employer] reporting period from 04/03/2020    Progress Note Due on Visit 10    PT Start Time 1705    PT Stop Time 1745    PT Time Calculation (min) 40 min    Activity Tolerance Patient tolerated treatment well;No increased pain    Behavior During Therapy WFL for tasks assessed/performed           Past Medical History:  Diagnosis Date  . Cardiac disease    FAMILY HX  . Dyslipidemia   . Elevated coronary artery calcium score     Past Surgical History:  Procedure Laterality Date  . VASECTOMY      There were no vitals filed for this visit.   Subjective Assessment - 04/25/20 1706    Subjective Patient reports he is feeling well and his back is not hurting. States his schedule has been very busy and he has not done his HEP. Felt fine following last treatment session.    Pertinent History Patient is a 60 y.o. male who presents to outpatient physical therapy with a referral for medical diagnosis  acute low back pain without sciatica, unspecified back pain laterality. This patient's chief complaints consist of episode of acute central/left low back pain in a setting of recurrent episodes over 30 years leading to the following functional deficits: difficulty with usual activities such as showering (washing legs), bending over, lifting legs up, lifting, work around the house, dressing, ADLs, IADLs, sleeping, prolonged sitting, getting up after sitting, changing positions.  Relevant past medical history and comorbidities include ADHD, every day smoker, risk factors for cardiovascular disease including dyslipidemia, elevated coronary artery calcium score, family history of CAD.Patient denies hx of cancer, stroke, seizures, lung problem, major cardiac events, diabetes, unexplained weight loss, changes in bowel or bladder problems, new onset stumbling or dropping things, spinal surgery.    Limitations Sitting;House hold activities;Lifting   Functional Limitations: difficulty with showering (washing legs), bending over, lifting legs up, lifting, work around the house, dressing, ADLs, IADLs, sleeping, prolonged sitting, getting up after sitting, changing positions.   Diagnostic tests Lumbar radiograph 01/23/2020: "IMPRESSION:  1. Moderate to marked severity multilevel degenerative changes." (see chart for full report)    Patient Stated Goals to learn how to exercise "correctly" to have a healthier back    Currently in Pain? No/denies            TREATMENT:  Therapeutic exercise:to centralize symptoms and improve ROM, strength, muscular endurance, and activity tolerance required for successful completion of functional activities. -NuStep level4using bilateral upper and lower extremities. Seat/handle setting 13. For improved extremity mobility, muscular endurance, and activity tolerance; and to induce the analgesic effect of aerobic exercise, stimulate improved joint nutrition, and prepare body structures and systems for following interventions. x69minutes. Average SPM = 104. - side stepping with red theraband around ankles, 3x10 steps each side. CGA- SBA for safety.    (manual therapy - see below)  - MDT lumbar  extension in lying x 10 - hooklyinglow trunk rotation x20 each side,  - quadruped cat-cow x10to find lumbar neutral - quadruped bird dog,3x10 each side. Cone over sacrum last  to challenge lumbopevlic stability.  - squat to tap on 18.5 plinth, 3x10  while holding 10# DB at goblet squat position. Feels it in low back. Cuing for full hip extension and appropriate flexion.  - dead lift with 20# DB, 2x10 (difficulty maintaining flat back and hinge position) - standing pallof press with cable, 3x10 each side, 10#.   Manual therapy: to reduce pain and tissue tension, improve range of motion, neuromodulation, in order to promote improved ability to complete functional activities. - prone CPA grade III-IV with KE wedge along mid thoracic spine to lower lumbar spine 10-20 reps seconds each segment.  - prone STM to lumbar parapinals  Pt required multimodal cuing for proper technique and to facilitate improved neuromuscular control, strength, range of motion, and functional ability resulting in improved performance and form.  HOME EXERCISE PROGRAM Access Code: [email or web address] URL: [email or web address] Date: 04/08/2020 Prepared by: Norton Blizzard  Exercises Supine Lower Trunk Rotation - 1 x daily - 20 reps Single Leg Bridge - 1 x daily - 3 sets - 10 reps Hamstring stretch (with strap) - 4 x weekly - 3 reps - 30 seconds hold     PT Education - 04/25/20 1708    Education Details Exercise purpose/form. Self management techniques    Person(s) Educated Patient    Methods Explanation;Demonstration;Tactile cues;Verbal cues    Comprehension Verbalized understanding;Returned demonstration;Verbal cues required;Tactile cues required;Need further instruction            PT Short Term Goals - 04/25/20 1727      PT SHORT TERM GOAL #1   Title Be independent with initial home exercise program for self-management of symptoms.    Baseline Initial HEP provided at IE (04/03/2020); has not yet found time (04/25/2020);    Time 2    Period Weeks    Status On-going    Target Date 04/17/20             PT Long Term Goals - 04/03/20 1806      PT LONG TERM GOAL #1   Title Be independent with a long-term home exercise program for self-management  of symptoms.    Baseline Initial HEP provided at IE (04/03/2020);    Time 12    Period Weeks    Status New   TARGET DATE FOR ALL LONG TERM GOALS: 06/26/2020     PT LONG TERM GOAL #2   Title Demonstrate improved FOTO score to equal or greater than 84 by visit #8 to demonstrate improvement in overall condition and self-reported functional ability.    Baseline 79 (04/03/2020);    Time 12    Period Weeks    Status New      PT LONG TERM GOAL #3   Title Be able to demonstrate floor to waist lift of equal or greater than 25# box with proper form without limitation due to current condition in order to improve ability to lift during usual activities.    Baseline 10# box with rounded back (04/03/2020);    Time 12    Period Weeks    Status New      PT LONG TERM GOAL #4   Title Complete community, work and/or recreational activities without limitation due to current condition.    Baseline Functional Limitations: difficulty with showering (washing legs), bending over, lifting legs up,  lifting, work around the house, dressing, ADLs, IADLs, sleeping, prolonged sitting, getting up after sitting, changing positions (04/03/2020);      PT LONG TERM GOAL #5   Title Reduce pain with functional activities to equal or less than 1/10 to allow patient to complete usual activities including ADLs, IADLs, and social engagement with less difficulty.    Baseline up to 4/10 (04/03/2020);    Time 12    Period Weeks    Status New                 Plan - 04/25/20 1735    Clinical Impression Statement Patient tolerated treatment well overall and continues to make progress towards goals. Able to complete more reps and deeper squats this session and demonstrates improved lumbopelvic control with bird dog exercises. Did have difficulty with hip hinge with dead lift that may be related to short hamstrings. Reported he felt fine by end of session but upon further discussion states "my back has my attention" and states he can  feel some discomfort in is back.  Will monitor at subsequent sessions. Patient would benefit from continued management of limiting condition by skilled physical therapist to address remaining impairments and functional limitations to work towards stated goals and return to PLOF or maximal functional independence.    Personal Factors and Comorbidities Age;Comorbidity 3+;Time since onset of injury/illness/exacerbation;Past/Current Experience;Fitness    Comorbidities Relevant past medical history and comorbidities include ADHD, every day smoker, risk factors for cardiovascular disease including dyslipidemia, elevated coronary artery calcium score, family history of CAD.    Examination-Activity Limitations Hygiene/Grooming;Bathing;Squat;Transfers;Dressing;Bend   Functional Limitations: difficulty with showering (washing legs), bending over, lifting legs up, lifting, work around the house, dressing, ADLs, IADLs, sleeping, prolonged sitting, getting up after sitting, changing positions.   Examination-Participation Restrictions Community Activity;Interpersonal Relationship   work around the house   Stability/Clinical Decision Making Stable/Uncomplicated    Rehab Potential Good    PT Frequency 2x / week    PT Duration 12 weeks   as needed   PT Treatment/Interventions ADLs/Self Care Home Management;Cryotherapy;Moist Heat;Electrical Stimulation;Therapeutic activities;Therapeutic exercise;Neuromuscular re-education;Patient/family education;Manual techniques;Dry needling;Spinal Manipulations;Joint Manipulations    PT Next Visit Plan motor control, mobility, and functional strengthening. manual therapy as needed    PT Home Exercise Plan Medbridge Access Code: [email or web address]    Consulted and Agree with Plan of Care Patient           Patient will benefit from skilled therapeutic intervention in order to improve the following deficits and impairments:  Improper body mechanics,Pain,Decreased coordination,Decreased  mobility,Decreased activity tolerance,Decreased endurance,Decreased range of motion,Decreased strength,Hypomobility,Impaired perceived functional ability,Impaired flexibility  Visit Diagnosis: Low back pain without sciatica, unspecified back pain laterality, unspecified chronicity     Problem List Patient Active Problem List   Diagnosis Date Noted  . Elevated coronary artery calcium score 04/05/2019  . Erectile dysfunction 02/22/2019  . Senile purpura (HCC) 02/22/2019  . Smoker 11/24/2013  . Abnormal electrocardiogram 03/19/2011  . ADHD (attention deficit hyperactivity disorder) 02/11/2011  . Family history of heart disease in male family member before age 67 02/11/2011  . Hyperlipidemia LDL goal <100 02/11/2011    Luretha Murphy. Ilsa Iha, PT, DPT 04/25/20, 6:45 PM  [facility] AND [facility]TS [facility]IC[facility] [address], [place], [address] Phone: [phone]   Fax:  [phone]  Name: JOSHAU CODE MRN: [id number] Date of Birth: 1960/05/20

## 2020-04-30 ENCOUNTER — Other Ambulatory Visit: Payer: Self-pay

## 2020-04-30 ENCOUNTER — Ambulatory Visit: Payer: BC Managed Care – PPO | Attending: Family Medicine | Admitting: Physical Therapy

## 2020-04-30 ENCOUNTER — Encounter: Payer: Self-pay | Admitting: Physical Therapy

## 2020-04-30 DIAGNOSIS — M545 Low back pain, unspecified: Secondary | ICD-10-CM | POA: Diagnosis present

## 2020-04-30 NOTE — Therapy (Signed)
[facility] PHYS[facility] SPORTS MEDICINE [address], [place], [address] Phone: [phone]   Fax:  [phone]  Physical Therapy Treatment  Patient Details  Name: David Jensen MRN: [id number] Date of Birth: 02-Dec-1960 Referring Provider (PT): Ronnald Nian, MD ([facility])   Encounter Date: 04/30/2020   PT End of Session - 04/30/20 1408    Visit Number 6    Number of Visits 24    Date for PT Re-Evaluation 06/26/20    Authorization Type [employer] reporting period from 04/03/2020    Progress Note Due on Visit 10    PT Start Time 1345    PT Stop Time 1425    PT Time Calculation (min) 40 min    Activity Tolerance Patient tolerated treatment well;No increased pain    Behavior During Therapy WFL for tasks assessed/performed           Past Medical History:  Diagnosis Date  . Cardiac disease    FAMILY HX  . Dyslipidemia   . Elevated coronary artery calcium score     Past Surgical History:  Procedure Laterality Date  . VASECTOMY      There were no vitals filed for this visit.   Subjective Assessment - 04/30/20 1352    Subjective Patient reports no pain upon arrival. States he felt okay following last treatment session. has done some of his exercises at home and they are going well. He has been doing the lower trunk rotation and double leg bridge.    Pertinent History Patient is a 60 y.o. male who presents to outpatient physical therapy with a referral for medical diagnosis  acute low back pain without sciatica, unspecified back pain laterality. This patient's chief complaints consist of episode of acute central/left low back pain in a setting of recurrent episodes over 30 years leading to the following functional deficits: difficulty with usual activities such as showering (washing legs), bending over, lifting legs up, lifting, work around the house, dressing, ADLs, IADLs, sleeping, prolonged sitting,  getting up after sitting, changing positions. Relevant past medical history and comorbidities include ADHD, every day smoker, risk factors for cardiovascular disease including dyslipidemia, elevated coronary artery calcium score, family history of CAD.Patient denies hx of cancer, stroke, seizures, lung problem, major cardiac events, diabetes, unexplained weight loss, changes in bowel or bladder problems, new onset stumbling or dropping things, spinal surgery.    Limitations Sitting;House hold activities;Lifting   Functional Limitations: difficulty with showering (washing legs), bending over, lifting legs up, lifting, work around the house, dressing, ADLs, IADLs, sleeping, prolonged sitting, getting up after sitting, changing positions.   Diagnostic tests Lumbar radiograph 01/23/2020: "IMPRESSION:  1. Moderate to marked severity multilevel degenerative changes." (see chart for full report)    Patient Stated Goals to learn how to exercise "correctly" to have a healthier back    Currently in Pain? No/denies           TREATMENT:  Therapeutic exercise:to centralize symptoms and improve ROM, strength, muscular endurance, and activity tolerance required for successful completion of functional activities. - side stepping with red theraband around ankles, 3x10 steps each side. CGA- SBA for safety.  - standing pallof press with cable, 3x15 each side, 10#. - quadruped bird dog,3x10 each side.Cone over sacrum last  to challenge lumbopevlic stability.  (manual therapy - see below)  - MDT lumbar extension in lying x 10  - quadruped cat-cow x10to find lumbar neutral - squat totap on  18.5 plinth, 3x10 while holding 15# DB at goblet squat position.Feels it in low back. Cuing for full hip extension and appropriate flexion.     - dead lift with 20# KB, 3x10 (improved carry over to maintain flat back and hinge position)    Manual therapy:to reduce pain and tissue tension, improve range of  motion, neuromodulation, in order to promote improved ability to complete functional activities. - prone CPA grade III-IV with KE wedge along mid thoracic spine to lower lumbar spine 10  reps seconds each segment.   Pt required multimodal cuing for proper technique and to facilitate improved neuromuscular control, strength, range of motion, and functional ability resulting in improved performance and form.  HOME EXERCISE PROGRAM Access Code: [email or web address] URL: [email or web address] Date: 04/30/2020 Prepared by: Norton Blizzard  Exercises Supine Lower Trunk Rotation - 1 x daily - 20 reps Single Leg Bridge - 1 x daily - 3 sets - 10 reps Bird Dog - 1 x daily - 3 sets - 10 reps Seated Anti-Rotation Press with Anchored Resistance Standing Hip Hinge with Dowel Squat with Chair Touch     PT Education - 04/30/20 1408    Education Details Exercise purpose/form. Self management techniques    Person(s) Educated Patient    Methods Explanation;Demonstration;Tactile cues;Verbal cues    Comprehension Verbalized understanding;Returned demonstration;Verbal cues required;Tactile cues required;Need further instruction            PT Short Term Goals - 04/25/20 1727      PT SHORT TERM GOAL #1   Title Be independent with initial home exercise program for self-management of symptoms.    Baseline Initial HEP provided at [facility] (04/03/2020); has not yet found time (04/25/2020);    Time 2    Period Weeks    Status On-going    Target Date 04/17/20             PT Long Term Goals - 04/03/20 1806      PT LONG TERM GOAL #1   Title Be independent with a long-term home exercise program for self-management of symptoms.    Baseline Initial HEP provided at [facility] (04/03/2020);    Time 12    Period Weeks    Status New   TARGET DATE FOR ALL LONG TERM GOALS: 06/26/2020     PT LONG TERM GOAL #2   Title Demonstrate improved FOTO score to equal or greater than 84 by visit #8 to demonstrate improvement in  overall condition and self-reported functional ability.    Baseline 79 (04/03/2020);    Time 12    Period Weeks    Status New      PT LONG TERM GOAL #3   Title Be able to demonstrate floor to waist lift of equal or greater than 25# box with proper form without limitation due to current condition in order to improve ability to lift during usual activities.    Baseline 10# box with rounded back (04/03/2020);    Time 12    Period Weeks    Status New      PT LONG TERM GOAL #4   Title Complete community, work and/or recreational activities without limitation due to current condition.    Baseline Functional Limitations: difficulty with showering (washing legs), bending over, lifting legs up, lifting, work around the house, dressing, ADLs, IADLs, sleeping, prolonged sitting, getting up after sitting, changing positions (04/03/2020);      PT LONG TERM GOAL #5   Title Reduce pain with functional activities to equal or  less than 1/10 to allow patient to complete usual activities including ADLs, IADLs, and social engagement with less difficulty.    Baseline up to 4/10 (04/03/2020);    Time 12    Period Weeks    Status New                 Plan - 04/30/20 1413    Clinical Impression Statement Patient tolerated treatment well and continues to progress in resistance and skill. Patient would benefit from continued management of limiting condition by skilled physical therapist to address remaining impairments and functional limitations to work towards stated goals and return to PLOF or maximal functional independence.    Personal Factors and Comorbidities Age;Comorbidity 3+;Time since onset of injury/illness/exacerbation;Past/Current Experience;Fitness    Comorbidities Relevant past medical history and comorbidities include ADHD, every day smoker, risk factors for cardiovascular disease including dyslipidemia, elevated coronary artery calcium score, family history of CAD.    Examination-Activity  Limitations Hygiene/Grooming;Bathing;Squat;Transfers;Dressing;Bend   Functional Limitations: difficulty with showering (washing legs), bending over, lifting legs up, lifting, work around the house, dressing, ADLs, IADLs, sleeping, prolonged sitting, getting up after sitting, changing positions.   Examination-Participation Restrictions Community Activity;Interpersonal Relationship   work around the house   Stability/Clinical Decision Making Stable/Uncomplicated    Rehab Potential Good    PT Frequency 2x / week    PT Duration 12 weeks   as needed   PT Treatment/Interventions ADLs/Self Care Home Management;Cryotherapy;Moist Heat;Electrical Stimulation;Therapeutic activities;Therapeutic exercise;Neuromuscular re-education;Patient/family education;Manual techniques;Dry needling;Spinal Manipulations;Joint Manipulations    PT Next Visit Plan motor control, mobility, and functional strengthening. manual therapy as needed    PT Home Exercise Plan Medbridge Access Code: [email or web address]    Consulted and Agree with Plan of Care Patient           Patient will benefit from skilled therapeutic intervention in order to improve the following deficits and impairments:  Improper body mechanics,Pain,Decreased coordination,Decreased mobility,Decreased activity tolerance,Decreased endurance,Decreased range of motion,Decreased strength,Hypomobility,Impaired perceived functional ability,Impaired flexibility  Visit Diagnosis: Low back pain without sciatica, unspecified back pain laterality, unspecified chronicity     Problem List Patient Active Problem List   Diagnosis Date Noted  . Elevated coronary artery calcium score 04/05/2019  . Erectile dysfunction 02/22/2019  . Senile purpura (HCC) 02/22/2019  . Smoker 11/24/2013  . Abnormal electrocardiogram 03/19/2011  . ADHD (attention deficit hyperactivity disorder) 02/11/2011  . Family history of heart disease in male family member before age 43 02/11/2011  .  Hyperlipidemia LDL goal <100 02/11/2011   Luretha Murphy. Ilsa Iha, PT, DPT 04/30/20, 2:18 PM  [facility] PHYSICAL AND SPORTS MEDICINE [address], [place], [address] Phone: [phone]   Fax:  [phone]  Name: David Jensen MRN: [id number] Date of Birth: [birth year]

## 2020-05-02 ENCOUNTER — Ambulatory Visit: Payer: BC Managed Care – PPO | Admitting: Physical Therapy

## 2020-05-02 ENCOUNTER — Other Ambulatory Visit: Payer: Self-pay

## 2020-05-02 ENCOUNTER — Encounter: Payer: Self-pay | Admitting: Physical Therapy

## 2020-05-02 DIAGNOSIS — M545 Low back pain, unspecified: Secondary | ICD-10-CM | POA: Diagnosis not present

## 2020-05-02 NOTE — Therapy (Signed)
[facility] PH[facility]TS MEDICINE [address] [place], [address] Phone: [phone]   Fax:  [phone]  Physical Therapy Treatment / Discharge Summary Reporting period: 04/03/2020 - 05/02/2020  Patient Details  Name: David Jensen MRN: [id number] Date of Birth: October 27, 1960 Referring Provider (PT): Ronnald Nian, MD ([facility])   Encounter Date: 05/02/2020   PT End of Session - 05/02/20 1404    Visit Number 7    Number of Visits 24    Date for PT Re-Evaluation 06/26/20    Authorization Type [employer] reporting period from 04/03/2020    Progress Note Due on Visit 10    PT Start Time 1348    PT Stop Time 1427    PT Time Calculation (min) 39 min    Activity Tolerance Patient tolerated treatment well;No increased pain    Behavior During Therapy WFL for tasks assessed/performed           Past Medical History:  Diagnosis Date  . Cardiac disease    FAMILY HX  . Dyslipidemia   . Elevated coronary artery calcium score     Past Surgical History:  Procedure Laterality Date  . VASECTOMY      There were no vitals filed for this visit.   Subjective Assessment - 05/02/20 1358    Subjective Patient reports no pain upon arrival or after last session. States he is feeling a little "shakey" because he has been eating a Keto-like diet since that is what his GF is doing. States he did eat today. At the end of the session, patient reported he is satisfied with his progress and care and feels confident and ready to discharge today.    Pertinent History Patient is a 60 y.o. male who presents to outpatient physical therapy with a referral for medical diagnosis  acute low back pain without sciatica, unspecified back pain laterality. This patient's chief complaints consist of episode of acute central/left low back pain in a setting of recurrent episodes over 30 years leading to the following functional deficits: difficulty  with usual activities such as showering (washing legs), bending over, lifting legs up, lifting, work around the house, dressing, ADLs, IADLs, sleeping, prolonged sitting, getting up after sitting, changing positions. Relevant past medical history and comorbidities include ADHD, every day smoker, risk factors for cardiovascular disease including dyslipidemia, elevated coronary artery calcium score, family history of CAD.Patient denies hx of cancer, stroke, seizures, lung problem, major cardiac events, diabetes, unexplained weight loss, changes in bowel or bladder problems, new onset stumbling or dropping things, spinal surgery.    Limitations Sitting;House hold activities;Lifting   Functional Limitations: difficulty with showering (washing legs), bending over, lifting legs up, lifting, work around the house, dressing, ADLs, IADLs, sleeping, prolonged sitting, getting up after sitting, changing positions.   Diagnostic tests Lumbar radiograph 01/23/2020: "IMPRESSION:  1. Moderate to marked severity multilevel degenerative changes." (see chart for full report)    Patient Stated Goals to learn how to exercise "correctly" to have a healthier back    Currently in Pain? No/denies           FOTO = 94  TREATMENT:  Therapeutic exercise:to centralize symptoms and improve ROM, strength, muscular endurance, and activity tolerance required for successful completion of functional activities. - side stepping with red theraband around ankles, 3x10 steps each side. CGA- SBA for safety. - standing lumbar rotation with foam roller behind back, x20 each way - standing chops/scoops with blue  theraband, 3x10 each side, each direction.(feels low back working).  - quadruped bird dog,3x10 each side.Cone over sacrum last to challenge lumbopevlic stability. - MDT lumbar extension in lying 2 x 10  - squat totap on18 inchchair,3x10 while holding 15# DB at goblet squat position.Feels it in low back. Cuing for  full hip extensionand appropriate flexion.   - dead liftwith 30# KB,3x10(improved carry over to maintain flat back and hinge position)     Pt required multimodal cuing for proper technique and to facilitate improved neuromuscular control, strength, range of motion, and functional ability resulting in improved performance and form.  HOME EXERCISE PROGRAM Access Code: [email or web address] URL: [email or web address] Date: 04/30/2020 Prepared by: Norton Blizzard  Exercises Supine Lower Trunk Rotation - 1 x daily - 20 reps Single Leg Bridge - 1 x daily - 3 sets - 10 reps Bird Dog - 1 x daily - 3 sets - 10 reps     PT Education - 05/02/20 1404    Education Details Exercise purpose/form. Self management techniques. discharge reccomendations.    Person(s) Educated Patient    Methods Explanation;Demonstration;Verbal cues    Comprehension Verbalized understanding;Returned demonstration;Verbal cues required            PT Short Term Goals - 05/02/20 1433      PT SHORT TERM GOAL #1   Title Be independent with initial home exercise program for self-management of symptoms.    Baseline Initial HEP provided at [facility] (04/03/2020); has not yet found time (04/25/2020); continues to be busy (05/02/2020);    Time 2    Period Weeks    Status Partially Met    Target Date 04/17/20             PT Long Term Goals - 05/02/20 1434      PT LONG TERM GOAL #1   Title Be independent with a long-term home exercise program for self-management of symptoms.    Baseline Initial HEP provided at [facility] (04/03/2020); has been provided but does not participate (05/02/2020);    Time 12    Period Weeks    Status Partially Met   TARGET DATE FOR ALL LONG TERM GOALS: 06/26/2020     PT LONG TERM GOAL #2   Title Demonstrate improved FOTO score to equal or greater than 84 by visit #8 to demonstrate improvement in overall condition and self-reported functional ability.    Baseline 79 (04/03/2020); 94 (05/02/2020);    Time 12     Period Weeks    Status Achieved      PT LONG TERM GOAL #3   Title Be able to demonstrate floor to waist lift of equal or greater than 25# box with proper form without limitation due to current condition in order to improve ability to lift during usual activities.    Baseline 10# box with rounded back (04/03/2020); 30# KB 3x10 with good foorm (05/02/2020);    Time 12    Period Weeks    Status Achieved      PT LONG TERM GOAL #4   Title Complete community, work and/or recreational activities without limitation due to current condition.    Baseline Functional Limitations: difficulty with showering (washing legs), bending over, lifting legs up, lifting, work around the house, dressing, ADLs, IADLs, sleeping, prolonged sitting, getting up after sitting, changing positions (04/03/2020); reports he is satisfied with his improvement (05/02/2020):    Status Achieved      PT LONG TERM GOAL #5   Title Reduce pain with functional activities  to equal or less than 1/10 to allow patient to complete usual activities including ADLs, IADLs, and social engagement with less difficulty.    Baseline up to 4/10 (04/03/2020); 0/10 (05/02/2020);    Time 12    Period Weeks    Status Achieved                 Plan - 05/02/20 1408    Clinical Impression Statement Patient has attended 7 physical therapy sessions this episode of care and has made good progress and achieved all goals except those pertaining to independence with HEP. He has only partially participated in HEP due to being very busy at work but states he feels ready for discharge and is satisfied with the exercise instruction he has received. Patient is now discharged from PT due to achieving most goals and being satisfied with his improvement.    Personal Factors and Comorbidities Age;Comorbidity 3+;Time since onset of injury/illness/exacerbation;Past/Current Experience;Fitness    Comorbidities Relevant past medical history and comorbidities include ADHD,  every day smoker, risk factors for cardiovascular disease including dyslipidemia, elevated coronary artery calcium score, family history of CAD.    Examination-Activity Limitations Hygiene/Grooming;Bathing;Squat;Transfers;Dressing;Bend   Functional Limitations: difficulty with showering (washing legs), bending over, lifting legs up, lifting, work around the house, dressing, ADLs, IADLs, sleeping, prolonged sitting, getting up after sitting, changing positions.   Examination-Participation Restrictions Community Activity;Interpersonal Relationship   work around the house   Stability/Clinical Decision Making Stable/Uncomplicated    Rehab Potential Good    PT Frequency 2x / week    PT Duration 12 weeks   as needed   PT Treatment/Interventions ADLs/Self Care Home Management;Cryotherapy;Moist Heat;Electrical Stimulation;Therapeutic activities;Therapeutic exercise;Neuromuscular re-education;Patient/family education;Manual techniques;Dry needling;Spinal Manipulations;Joint Manipulations    PT Next Visit Plan Patient is now discharged from physical therapy due to improvement in condition    PT Home Exercise Plan Medbridge Access Code: [email or web address]    Consulted and Agree with Plan of Care Patient           Patient will benefit from skilled therapeutic intervention in order to improve the following deficits and impairments:  Improper body mechanics,Pain,Decreased coordination,Decreased mobility,Decreased activity tolerance,Decreased endurance,Decreased range of motion,Decreased strength,Hypomobility,Impaired perceived functional ability,Impaired flexibility  Visit Diagnosis: Low back pain without sciatica, unspecified back pain laterality, unspecified chronicity     Problem List Patient Active Problem List   Diagnosis Date Noted  . Elevated coronary artery calcium score 04/05/2019  . Erectile dysfunction 02/22/2019  . Senile purpura (HCC) 02/22/2019  . Smoker 11/24/2013  . Abnormal electrocardiogram  03/19/2011  . ADHD (attention deficit hyperactivity disorder) 02/11/2011  . Family history of heart disease in male family member before age 36 02/11/2011  . Hyperlipidemia LDL goal <100 02/11/2011    Luretha Murphy. Ilsa Iha, PT, DPT 05/02/20, 7:41 PM  [facility] PHYSICAL AND SPORTS MEDICINE [address] [place], [address] Phone: [phone]   Fax:  [phone]  Name: David Jensen MRN: [id number] Date of Birth: October 09, 1960

## 2020-05-07 ENCOUNTER — Encounter: Payer: BC Managed Care – PPO | Admitting: Physical Therapy

## 2020-05-09 ENCOUNTER — Encounter: Payer: BC Managed Care – PPO | Admitting: Physical Therapy

## 2020-05-13 ENCOUNTER — Encounter: Payer: BC Managed Care – PPO | Admitting: Physical Therapy

## 2020-05-15 ENCOUNTER — Encounter: Payer: BC Managed Care – PPO | Admitting: Physical Therapy

## 2020-05-20 ENCOUNTER — Encounter: Payer: BC Managed Care – PPO | Admitting: Physical Therapy

## 2020-05-22 ENCOUNTER — Encounter: Payer: BC Managed Care – PPO | Admitting: Physical Therapy

## 2020-05-27 ENCOUNTER — Encounter: Payer: BC Managed Care – PPO | Admitting: Physical Therapy

## 2020-05-29 ENCOUNTER — Encounter: Payer: BC Managed Care – PPO | Admitting: Physical Therapy

## 2020-06-03 ENCOUNTER — Encounter: Payer: BC Managed Care – PPO | Admitting: Physical Therapy

## 2020-06-05 ENCOUNTER — Encounter: Payer: BC Managed Care – PPO | Admitting: Physical Therapy

## 2020-06-10 ENCOUNTER — Encounter: Payer: BC Managed Care – PPO | Admitting: Physical Therapy

## 2020-06-12 ENCOUNTER — Encounter: Payer: BC Managed Care – PPO | Admitting: Physical Therapy

## 2020-06-27 ENCOUNTER — Other Ambulatory Visit: Payer: Self-pay | Admitting: Cardiology

## 2020-07-03 ENCOUNTER — Encounter: Payer: Self-pay | Admitting: Family Medicine

## 2020-07-03 ENCOUNTER — Ambulatory Visit: Payer: BC Managed Care – PPO | Admitting: Family Medicine

## 2020-07-03 ENCOUNTER — Other Ambulatory Visit: Payer: Self-pay

## 2020-07-03 VITALS — BP 110/70 | HR 89 | Temp 97.5°F | Ht 73.5 in | Wt 183.2 lb

## 2020-07-03 DIAGNOSIS — Z8249 Family history of ischemic heart disease and other diseases of the circulatory system: Secondary | ICD-10-CM | POA: Diagnosis not present

## 2020-07-03 DIAGNOSIS — Z0000 Encounter for general adult medical examination without abnormal findings: Secondary | ICD-10-CM

## 2020-07-03 DIAGNOSIS — E785 Hyperlipidemia, unspecified: Secondary | ICD-10-CM | POA: Diagnosis not present

## 2020-07-03 DIAGNOSIS — D692 Other nonthrombocytopenic purpura: Secondary | ICD-10-CM | POA: Diagnosis not present

## 2020-07-03 DIAGNOSIS — F900 Attention-deficit hyperactivity disorder, predominantly inattentive type: Secondary | ICD-10-CM | POA: Diagnosis not present

## 2020-07-03 DIAGNOSIS — Z716 Tobacco abuse counseling: Secondary | ICD-10-CM

## 2020-07-03 DIAGNOSIS — R931 Abnormal findings on diagnostic imaging of heart and coronary circulation: Secondary | ICD-10-CM

## 2020-07-03 LAB — POCT URINALYSIS DIP (PROADVANTAGE DEVICE)
Glucose, UA: NEGATIVE mg/dL
Ketones, POC UA: NEGATIVE mg/dL
Leukocytes, UA: NEGATIVE
Nitrite, UA: NEGATIVE
Protein Ur, POC: NEGATIVE mg/dL
Specific Gravity, Urine: 1.025
Urobilinogen, Ur: 0.2
pH, UA: 6 (ref 5.0–8.0)

## 2020-07-03 MED ORDER — ATORVASTATIN CALCIUM 40 MG PO TABS: 1.0000 | ORAL_TABLET | Freq: Every day | ORAL | 3 refills | Status: DC

## 2020-07-03 NOTE — Progress Notes (Signed)
   Subjective:    Patient ID: David Jensen, male    DOB: [birth year], 60 y.o.   MRN: [id number]  HPI He is here for complete examination.  He has been seen by cardiology recently and was noted to have elevated cardiac calcium score.  He is on atorvastatin 40 mg.  He does have underlying ADD and uses his medication on an as-needed basis.  He is also using sildenafil with good results.  He is dating someone and it seems to be going fairly well.  He continues to smoke his pipe and is not interested in quitting.  Otherwise his family and social history as well as health maintenance immunizations was reviewed   Review of Systems  All other systems reviewed and are negative.      Objective:   Physical Exam Alert and in no distress. Tympanic membranes and canals are normal. Pharyngeal area is normal. Neck is supple without adenopathy or thyromegaly. Cardiac exam shows a regular sinus rhythm without murmurs or gallops. Lungs are clear to auscultation. Exam of the skin does show purpuric lesions on his arms. Urine microscopic was negative.    Assessment & Plan:  Routine general medical examination at a health care facility - Plan: POCT Urinalysis DIP (Proadvantage Device), CBC with Differential/Platelet, Comprehensive metabolic panel, Lipid panel  Senile purpura (HCC)  Attention deficit hyperactivity disorder (ADHD), predominantly inattentive type  Family history of heart disease in male family member before age 53 - Plan: CBC with Differential/Platelet, Comprehensive metabolic panel, Lipid panel, atorvastatin (LIPITOR) 40 MG tablet  Hyperlipidemia LDL goal <100 - Plan: Lipid panel, atorvastatin (LIPITOR) 40 MG tablet  Elevated coronary artery calcium score - Plan: atorvastatin (LIPITOR) 40 MG tablet  Encounter for smoking cessation counseling  Encouraged him to remain as physically active as possible.  Explained there is nothing we can really do about the senile purpura.  He will call when  he needs a refill on his ADD medication.  He will continue to be followed by cardiology.  He smokes a pipe and is not interested in quitting.

## 2020-07-04 LAB — LIPID PANEL
Chol/HDL Ratio: 3.2 ratio (ref 0.0–5.0)
Cholesterol, Total: 208 mg/dL — ABNORMAL HIGH (ref 100–199)
HDL: 65 mg/dL (ref >39)
LDL Chol Calc (NIH): 134 mg/dL — ABNORMAL HIGH (ref 0–99)
Triglycerides: 50 mg/dL (ref 0–149)
VLDL Cholesterol Cal: 9 mg/dL (ref 5–40)

## 2020-07-04 LAB — COMPREHENSIVE METABOLIC PANEL
ALT: 18 IU/L (ref 0–44)
AST: 22 IU/L (ref 0–40)
Albumin/Globulin Ratio: 2 (ref 1.2–2.2)
Albumin: 4.5 g/dL (ref 3.8–4.9)
Alkaline Phosphatase: 60 IU/L (ref 44–121)
BUN/Creatinine Ratio: 22 (ref 10–24)
BUN: 21 mg/dL (ref 8–27)
Bilirubin Total: 0.7 mg/dL (ref 0.0–1.2)
CO2: 23 mmol/L (ref 20–29)
Calcium: 9.7 mg/dL (ref 8.6–10.2)
Chloride: 101 mmol/L (ref 96–106)
Creatinine, Ser: 0.96 mg/dL (ref 0.76–1.27)
Globulin, Total: 2.3 g/dL (ref 1.5–4.5)
Glucose: 106 mg/dL — ABNORMAL HIGH (ref 65–99)
Potassium: 5.1 mmol/L (ref 3.5–5.2)
Sodium: 140 mmol/L (ref 134–144)
Total Protein: 6.8 g/dL (ref 6.0–8.5)
eGFR: 90 mL/min/{1.73_m2} (ref >59)

## 2020-07-04 LAB — CBC WITH DIFFERENTIAL/PLATELET
Basophils Absolute: 0.1 10*3/uL (ref 0.0–0.2)
Basos: 1 %
EOS (ABSOLUTE): 0.2 10*3/uL (ref 0.0–0.4)
Eos: 4 %
Hematocrit: 45.6 % (ref 37.5–51.0)
Hemoglobin: 15.6 g/dL (ref 13.0–17.7)
Immature Grans (Abs): 0 10*3/uL (ref 0.0–0.1)
Immature Granulocytes: 0 %
Lymphocytes Absolute: 1.3 10*3/uL (ref 0.7–3.1)
Lymphs: 20 %
MCH: 34.5 pg — ABNORMAL HIGH (ref 26.6–33.0)
MCHC: 34.2 g/dL (ref 31.5–35.7)
MCV: 101 fL — ABNORMAL HIGH (ref 79–97)
Monocytes Absolute: 0.7 10*3/uL (ref 0.1–0.9)
Monocytes: 11 %
Neutrophils Absolute: 4.2 10*3/uL (ref 1.4–7.0)
Neutrophils: 64 %
Platelets: 179 10*3/uL (ref 150–450)
RBC: 4.52 x10E6/uL (ref 4.14–5.80)
RDW: 11.8 % (ref 11.6–15.4)
WBC: 6.6 10*3/uL (ref 3.4–10.8)

## 2020-07-04 LAB — HGB A1C W/O EAG: Hgb A1c MFr Bld: 5.3 % (ref 4.8–5.6)

## 2020-07-04 LAB — SPECIMEN STATUS REPORT

## 2020-08-21 ENCOUNTER — Encounter: Payer: Self-pay | Admitting: Cardiology

## 2020-12-23 ENCOUNTER — Other Ambulatory Visit: Payer: Self-pay

## 2020-12-23 ENCOUNTER — Ambulatory Visit: Payer: BC Managed Care – PPO | Admitting: Family Medicine

## 2020-12-23 VITALS — BP 120/84 | HR 106 | Temp 98.6°F | Wt 185.0 lb

## 2020-12-23 DIAGNOSIS — R350 Frequency of micturition: Secondary | ICD-10-CM

## 2020-12-23 DIAGNOSIS — Z23 Encounter for immunization: Secondary | ICD-10-CM

## 2020-12-23 DIAGNOSIS — M7581 Other shoulder lesions, right shoulder: Secondary | ICD-10-CM

## 2020-12-23 LAB — POCT URINALYSIS DIP (PROADVANTAGE DEVICE)
Bilirubin, UA: NEGATIVE
Blood, UA: NEGATIVE
Glucose, UA: NEGATIVE mg/dL
Ketones, POC UA: NEGATIVE mg/dL
Leukocytes, UA: NEGATIVE
Nitrite, UA: NEGATIVE
Protein Ur, POC: NEGATIVE mg/dL
Specific Gravity, Urine: 1.015
Urobilinogen, Ur: 0.2
pH, UA: 6.5 (ref 5.0–8.0)

## 2020-12-23 LAB — POCT GLYCOSYLATED HEMOGLOBIN (HGB A1C): Hemoglobin A1C: 4.9 % (ref 4.0–5.6)

## 2020-12-23 NOTE — Patient Instructions (Signed)
Rotator Cuff Tendinitis Rotator cuff tendinitis is inflammation of the tendons in the rotator cuff. Tendons are tough, cord-like bands that connect muscle to bone. The rotator cuff includes all of the muscles and tendons that connect the arm to the shoulder. The rotator cuff holds the head of the humerus, or the upper arm bone, in the cup of the shoulder blade (scapula). This condition can lead to a long-term or chronic tear. The tear may be partial or complete. What are the causes? This condition is usually caused by overusing the rotator cuff. What increases the risk? This condition is more likely to develop in athletes and workers who frequently use their shoulder or reach over their heads. This can include activities such as: Tennis. Baseball or softball. Swimming. Construction work. Painting. What are the signs or symptoms? Symptoms of this condition include: Pain that spreads (radiates) from the shoulder to the upper arm. Swelling and tenderness in front of the shoulder. Pain when reaching, pulling, or lifting the arm above the head. Pain when lowering the arm from above the head. Minor pain in the shoulder when resting. Increased pain in the shoulder at night. Difficulty placing the arm behind the back. How is this diagnosed? This condition is diagnosed with a physical exam and medical history. Tests may also be done, including: X-rays. MRI. Ultrasound. CT with or without contrast. How is this treated? Treatment for this condition depends on the severity of the condition. In less severe cases, treatment may include: Rest. This may be done with a sling that holds the shoulder still (immobilization). Your health care provider may also recommend avoiding activities that involve lifting your arm over your head. Icing the shoulder. Anti-inflammatory medicines, such as aspirin or ibuprofen. In more severe cases, treatment may include: Physical therapy. Steroid  injections. Surgery. Follow these instructions at home: If you have a sling: Wear the sling as told by your health care provider. Remove it only as told by your health care provider. Loosen it if your fingers tingle, become numb, or turn cold and blue. Keep it clean. If the sling is not waterproof: Do not let it get wet. Cover it with a watertight covering when you take a bath or shower. Managing pain, stiffness, and swelling  If directed, put ice on the injured area. To do this: If you have a removable sling, remove it as told by your health care provider. Put ice in a plastic bag. Place a towel between your skin and the bag. Leave the ice on for 20 minutes, 2-3 times a day. Move your fingers often to reduce stiffness and swelling. Raise (elevate) the injured area above the level of your heart while you are lying down. Find a comfortable sleeping position, or sleep in a recliner, if available. Activity Rest your shoulder as told by your health care provider. Ask your health care provider when it is safe to drive if you have a sling on your arm. Return to your normal activities as told by your health care provider. Ask your health care provider what activities are safe for you. Do any exercises or stretches as told by your health care provider or physical therapist. If you do repetitive overhead tasks, take small breaks in between and include stretching exercises as told by your health care provider. General instructions Do not use any products that contain nicotine or tobacco, such as cigarettes, e-cigarettes, and chewing tobacco. These can delay healing. If you need help quitting, ask your health care provider. Take   over-the-counter and prescription medicines only as told by your health care provider. Keep all follow-up visits as told by your health care provider. This is important. Contact a health care provider if: Your pain gets worse. You have new pain in your arm, hands, or  fingers. Your pain is not relieved with medicine or does not get better after 6 weeks of treatment. You have crackling sensations when moving your shoulder in certain directions. You hear a snapping sound after using your shoulder, followed by severe pain and weakness. Get help right away if: Your arm, hand, or fingers are numb or tingling. Your arm, hand, or fingers are swollen or painful or they turn white or blue. Summary Rotator cuff tendinitis is inflammation of the tendons in the rotator cuff. Tendons are tough, cord-like bands that connect muscle to bone. This condition is usually caused by overusing the rotator cuff, which includes all of the muscles and tendons that connect the arm to the shoulder. This condition is more likely to develop in athletes and workers who frequently use their shoulder or reach over their heads. Treatment generally includes rest, anti-inflammatory medicines, and icing. In some cases, physical therapy and steroid injections may be needed. In severe cases, surgery may be needed. This information is not intended to replace advice given to you by your health care provider. Make sure you discuss any questions you have with your health care provider. Document Revised: 12/19/2018 Document Reviewed: 12/19/2018 Elsevier Patient Education  2022 [employer]  

## 2020-12-23 NOTE — Progress Notes (Signed)
   Subjective:    Patient ID: David Jensen, male    DOB: 1960/09/14, 60 y.o.   MRN: [id number]  HPI He complains of a 2-week history of right shoulder discomfort.  He notes that especially when he abducts and externally rotates it.  He is not had any injury to it or overuse.  Does have a previous history of this that did require an injection which helped. He also complains of a several month history of urinary frequency but no nocturia only x1.  He states that his weight is down slightly.  He has no hesitancy, urgency, decreased stream or incomplete emptying.  He has had previous PSA tests but they were all below 1. He does drink 2 or 3 beers at night.  Review of Systems     Objective:   Physical Exam Exam of the right shoulder shows full motion.  Drop arm test was uncomfortable.  Neer's and Hawkins test was positive for pain.  Negative sulcus sign.  No tenderness over the bicipital groove.  Urine dipstick was negative.  Hemoglobin A1c is 4.9.       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  He was comfortable with that. I then discussed the urinary frequency and recommended to cut down on alcohol consumption and nothing within 2 hours of bedtime.  He is also to empty his bladder at that point to see if that improves the urinary frequency.  He does state that the frequency is more of an issue during the day which could also be the fact that he strained himself and that he is overly hydrated during the day.  He will keep me informed.

## 2021-06-17 ENCOUNTER — Encounter: Payer: Self-pay | Admitting: Family Medicine

## 2021-07-11 ENCOUNTER — Ambulatory Visit: Payer: BC Managed Care – PPO | Admitting: Family Medicine

## 2021-07-11 ENCOUNTER — Encounter: Payer: Self-pay | Admitting: Family Medicine

## 2021-07-11 VITALS — BP 110/72 | HR 85 | Temp 97.2°F | Ht 73.5 in | Wt 185.0 lb

## 2021-07-11 DIAGNOSIS — Z0000 Encounter for general adult medical examination without abnormal findings: Secondary | ICD-10-CM

## 2021-07-11 DIAGNOSIS — F900 Attention-deficit hyperactivity disorder, predominantly inattentive type: Secondary | ICD-10-CM

## 2021-07-11 DIAGNOSIS — N529 Male erectile dysfunction, unspecified: Secondary | ICD-10-CM

## 2021-07-11 DIAGNOSIS — Z125 Encounter for screening for malignant neoplasm of prostate: Secondary | ICD-10-CM

## 2021-07-11 DIAGNOSIS — F17200 Nicotine dependence, unspecified, uncomplicated: Secondary | ICD-10-CM

## 2021-07-11 DIAGNOSIS — E785 Hyperlipidemia, unspecified: Secondary | ICD-10-CM

## 2021-07-11 DIAGNOSIS — D692 Other nonthrombocytopenic purpura: Secondary | ICD-10-CM | POA: Diagnosis not present

## 2021-07-11 DIAGNOSIS — R931 Abnormal findings on diagnostic imaging of heart and coronary circulation: Secondary | ICD-10-CM

## 2021-07-11 DIAGNOSIS — R6882 Decreased libido: Secondary | ICD-10-CM

## 2021-07-11 DIAGNOSIS — Z8249 Family history of ischemic heart disease and other diseases of the circulatory system: Secondary | ICD-10-CM

## 2021-07-11 LAB — POCT URINALYSIS DIP (PROADVANTAGE DEVICE)
Bilirubin, UA: NEGATIVE
Glucose, UA: NEGATIVE mg/dL
Ketones, POC UA: NEGATIVE mg/dL
Leukocytes, UA: NEGATIVE
Nitrite, UA: NEGATIVE
Protein Ur, POC: NEGATIVE mg/dL
Specific Gravity, Urine: 1.02
Urobilinogen, Ur: 0.2
pH, UA: 7 (ref 5.0–8.0)

## 2021-07-11 MED ORDER — ATORVASTATIN CALCIUM 40 MG PO TABS: 40.0000 mg | ORAL_TABLET | Freq: Every day | ORAL | 3 refills | Status: DC

## 2021-07-11 NOTE — Progress Notes (Signed)
? ?  Subjective:  ? ? Patient ID: David Jensen, male    DOB: [birth year], 61 y.o.   MRN: [id number] ? ?HPI ?He is here for complete examination.  He does have a history of elevated coronary calcium score he has been seen by cardiology in the past and apparently did have stress testing.  He has had no difficulty with chest pain, shortness of breath, PND.  He continues on atorvastatin and having no difficulty with that.  There has been a question about low libido.  He thinks that his energy and stamina are okay.  He would also like to have PSA testing done.  He does have underlying ADD and does use Ritalin but on an as-needed basis only.  He also uses sildenafil intermittently. ?He does use a pipe.  He is now a grandfather!.  Otherwise his family and social history as well as health maintenance and immunizations was reviewed. ? ?Review of Systems  ?All other systems reviewed and are negative. ? ?   ?Objective:  ? Physical Exam ?Alert and in no distress. Tympanic membranes and canals are normal. Pharyngeal area is normal. Neck is supple without adenopathy or thyromegaly. Cardiac exam shows a regular sinus rhythm without murmurs or gallops. Lungs are clear to auscultation.  Purpuric lesions noted especially on the left forearm ?Abdominal exam shows no masses or tenderness.  Genitalia normal circumcised male ,testes normal. ? ? ? ?   ?Assessment & Plan:  ?Routine general medical examination at a health care facility - Plan: CBC with Differential/Platelet, Comprehensive metabolic panel, Lipid panel ? ?Elevated coronary artery calcium score - Plan: Lipid panel ? ?Senile purpura ([place]) - Plan: CBC with Differential/Platelet, Comprehensive metabolic panel ? ?Attention deficit hyperactivity disorder (ADHD), predominantly inattentive type ? ?Erectile dysfunction, unspecified erectile dysfunction type ? ?Hyperlipidemia LDL goal <100 - Plan: Lipid panel ? ?Smoker ? ?Low libido - Plan: CBC with Differential/Platelet, Comprehensive  metabolic panel, Testosterone ? ?Screening for prostate cancer - Plan: PSA ? ? ?

## 2021-07-11 NOTE — Addendum Note (Signed)
Addended by: Renelda Loma on: 07/11/2021 11:20 AM ? ? Modules accepted: Orders ? ?

## 2021-07-12 LAB — CBC WITH DIFFERENTIAL/PLATELET
Basophils Absolute: 0.1 10*3/uL (ref 0.0–0.2)
Basos: 1 %
EOS (ABSOLUTE): 0.3 10*3/uL (ref 0.0–0.4)
Eos: 4 %
Hematocrit: 47.4 % (ref 37.5–51.0)
Hemoglobin: 16.5 g/dL (ref 13.0–17.7)
Immature Grans (Abs): 0 10*3/uL (ref 0.0–0.1)
Immature Granulocytes: 0 %
Lymphocytes Absolute: 1.7 10*3/uL (ref 0.7–3.1)
Lymphs: 25 %
MCH: 35.6 pg — ABNORMAL HIGH (ref 26.6–33.0)
MCHC: 34.8 g/dL (ref 31.5–35.7)
MCV: 102 fL — ABNORMAL HIGH (ref 79–97)
Monocytes Absolute: 0.6 10*3/uL (ref 0.1–0.9)
Monocytes: 9 %
Neutrophils Absolute: 4.3 10*3/uL (ref 1.4–7.0)
Neutrophils: 61 %
Platelets: 173 10*3/uL (ref 150–450)
RBC: 4.64 x10E6/uL (ref 4.14–5.80)
RDW: 11.8 % (ref 11.6–15.4)
WBC: 7 10*3/uL (ref 3.4–10.8)

## 2021-07-12 LAB — LIPID PANEL
Chol/HDL Ratio: 2.6 ratio (ref 0.0–5.0)
Cholesterol, Total: 161 mg/dL (ref 100–199)
HDL: 63 mg/dL (ref >39)
LDL Chol Calc (NIH): 87 mg/dL (ref 0–99)
Triglycerides: 55 mg/dL (ref 0–149)
VLDL Cholesterol Cal: 11 mg/dL (ref 5–40)

## 2021-07-12 LAB — PSA: Prostate Specific Ag, Serum: 0.4 ng/mL (ref 0.0–4.0)

## 2021-07-12 LAB — COMPREHENSIVE METABOLIC PANEL
ALT: 19 IU/L (ref 0–44)
AST: 25 IU/L (ref 0–40)
Albumin/Globulin Ratio: 2.1 (ref 1.2–2.2)
Albumin: 4.6 g/dL (ref 3.8–4.8)
Alkaline Phosphatase: 59 IU/L (ref 44–121)
BUN/Creatinine Ratio: 12 (ref 10–24)
BUN: 11 mg/dL (ref 8–27)
Bilirubin Total: 0.6 mg/dL (ref 0.0–1.2)
CO2: 24 mmol/L (ref 20–29)
Calcium: 9.9 mg/dL (ref 8.6–10.2)
Chloride: 102 mmol/L (ref 96–106)
Creatinine, Ser: 0.94 mg/dL (ref 0.76–1.27)
Globulin, Total: 2.2 g/dL (ref 1.5–4.5)
Glucose: 99 mg/dL (ref 70–99)
Potassium: 5.5 mmol/L — ABNORMAL HIGH (ref 3.5–5.2)
Sodium: 141 mmol/L (ref 134–144)
Total Protein: 6.8 g/dL (ref 6.0–8.5)
eGFR: 92 mL/min/{1.73_m2} (ref >59)

## 2021-07-12 LAB — TESTOSTERONE: Testosterone: 407 ng/dL (ref 264–916)

## 2021-12-03 ENCOUNTER — Encounter: Payer: Self-pay | Admitting: Internal Medicine

## 2021-12-24 ENCOUNTER — Other Ambulatory Visit: Payer: Self-pay

## 2021-12-24 ENCOUNTER — Encounter (HOSPITAL_BASED_OUTPATIENT_CLINIC_OR_DEPARTMENT_OTHER): Payer: Self-pay | Admitting: Emergency Medicine

## 2021-12-24 ENCOUNTER — Emergency Department (HOSPITAL_BASED_OUTPATIENT_CLINIC_OR_DEPARTMENT_OTHER)
Admission: EM | Admit: 2021-12-24 | Discharge: 2021-12-24 | Disposition: A | Discharge status: Home / Self Care | Payer: BC Managed Care – PPO | Attending: Emergency Medicine | Admitting: Emergency Medicine

## 2021-12-24 ENCOUNTER — Telehealth: Payer: Self-pay | Admitting: Family Medicine

## 2021-12-24 DIAGNOSIS — L500 Allergic urticaria: Secondary | ICD-10-CM | POA: Diagnosis not present

## 2021-12-24 DIAGNOSIS — Z7982 Long term (current) use of aspirin: Secondary | ICD-10-CM | POA: Insufficient documentation

## 2021-12-24 DIAGNOSIS — F17210 Nicotine dependence, cigarettes, uncomplicated: Secondary | ICD-10-CM | POA: Diagnosis not present

## 2021-12-24 DIAGNOSIS — T7840XA Allergy, unspecified, initial encounter: Secondary | ICD-10-CM | POA: Diagnosis present

## 2021-12-24 MED ORDER — FAMOTIDINE IN NACL 20-0.9 MG/50ML-% IV SOLN
20.0000 mg | Freq: Once | INTRAVENOUS | Status: AC
  Administered 2021-12-24: 20 mg via INTRAVENOUS
  Filled 2021-12-24: qty 50

## 2021-12-24 MED ORDER — DEXAMETHASONE SODIUM PHOSPHATE 10 MG/ML IJ SOLN
10.0000 mg | Freq: Once | INTRAMUSCULAR | Status: AC
  Administered 2021-12-24: 10 mg via INTRAVENOUS
  Filled 2021-12-24: qty 1

## 2021-12-24 MED ORDER — EPINEPHRINE 0.3 MG/0.3ML IJ SOAJ: INTRAMUSCULAR | 0 refills | Status: AC

## 2021-12-24 NOTE — Telephone Encounter (Signed)
Transition Care Management Follow-up Telephone Call Date of discharge and from where: 12/24/2021 [facility] How have you been since you were released from the hospital? States about 70% back to normal Any questions or concerns? No  Items Reviewed: Did the pt receive and understand the discharge instructions provided? Yes  Medications obtained and verified? Yes  Other? Yes  Pt has yet to pick up EPI pen. He was advised to pick up ASAP Any new allergies since your discharge? Yes pt was seen due to acute reaction of unknown origin  Dietary orders reviewed? Yes Do you have support at home? Yes   Home Care and Equipment/Supplies: Were home health services ordered? not applicable  Follow up appointments reviewed:  PCP Hospital f/u appt confirmed? Yes  Scheduled to see JCL on 12/30/2021 @ 11:15. Are transportation arrangements needed? No  If their condition worsens, is the pt aware to call PCP or go to the Emergency Dept.? Yes Was the patient provided with contact information for the PCP's office or ED? Yes Was to pt encouraged to call back with questions or concerns? Yes

## 2021-12-24 NOTE — ED Provider Notes (Signed)
[facility] Provider Note: [name], MD, FACEP  CSN: [id number] MRN: [id number] ARRIVAL: 12/24/21 at [place]: [place]  Allergic Reaction   HISTORY OF PRESENT ILLNESS  12/24/21 2:32 AM David Jensen is a 61 y.o. male who awakened from sleep about 1 AM with generalized hives most prominent on his lower extremities.  He also felt some tightness in his throat and and urged to move his bowels.  He does not feel like he is short of breath and his lungs but that the tightness in his throat was making it harder to breathe.  He took 25 mg of doxylamine succinate followed by 50 mg of diphenhydramine.  He is now having improvement in his symptoms.  His throat tightness is improving and his hives, though still present, are less pruritic.  He denies any nausea, vomiting or diarrhea.  He has no history of allergic reactions in the past.  He does not know what may have triggered this.  He ate a hamburger for dinner about 7 PM yesterday evening.   Past Medical History:  Diagnosis Date   Cardiac disease    FAMILY HX   Dyslipidemia    Elevated coronary artery calcium score     Past Surgical History:  Procedure Laterality Date   VASECTOMY      Family History  Problem Relation Age of Onset   Heart disease Father 32       Died with an MI   Atrial fibrillation Mother    Anxiety disorder Mother    Cancer Maternal Grandmother        lung    Social History   Tobacco Use   Smoking status: Every Day    Packs/day: 0.00    Years: 30.00    Total pack years: 0.00    Types: Pipe, Cigarettes   Smokeless tobacco: Never  Substance Use Topics   Alcohol use: Yes    Alcohol/week: 14.0 standard drinks of alcohol    Types: 14 Glasses of wine per week   Drug use: No    Prior to Admission medications   Medication Sig Start Date End Date Taking? Authorizing Provider  EPINEPHrine 0.3 mg/0.3 mL IJ SOAJ injection Self inject per package instructions as needed for  severe allergic reaction and seek medical attention. 12/24/21  Yes Saivon Prowse, MD  aspirin EC 81 MG tablet Take 1 tablet (81 mg total) by mouth daily. 04/06/19   Minus Breeding, MD  atorvastatin (LIPITOR) 40 MG tablet Take 1 tablet (40 mg total) by mouth daily. 07/11/21   Denita Lung, MD  Multiple Vitamins-Minerals (MULTIVITAMIN WITH MINERALS) tablet Take 1 tablet by mouth daily.    [provider]  Omega-3 Fatty Acids (FISH OIL PO) Take by mouth.    [provider]    Allergies Codeine   REVIEW OF SYSTEMS  Negative except as noted here or in the History of Present Illness.   PHYSICAL EXAMINATION  Initial Vital Signs Blood pressure (!) 112/95, pulse (!) 106, temperature 98.8 F (37.1 C), resp. rate 20, height 6' 1.5" (1.867 m), weight 84 kg, SpO2 97 %.  Examination General: Well-developed, well-nourished male in no acute distress; appearance consistent with age of record HENT: normocephalic; atraumatic; no tongue or pharyngeal edema; no dysphonia; no stridor Eyes: Normal appearance Neck: supple Heart: regular rate and rhythm Lungs: clear to auscultation bilaterally Abdomen: soft; nondistended; nontender; bowel sounds present Extremities: No deformity; full range of motion; pulses normal Neurologic: Awake,  alert and oriented; motor function intact in all extremities and symmetric; no facial droop Skin: Warm and dry; generalized urticarial rash most prominent on the lower extremities Psychiatric: Normal mood and affect   RESULTS  Summary of this visit's results, reviewed and interpreted by myself:   EKG Interpretation  Date/Time:    Ventricular Rate:    PR Interval:    QRS Duration:   QT Interval:    QTC Calculation:   R Axis:     Text Interpretation:         Laboratory Studies: No results found for this or any previous visit (from the past 24 hour(s)). Imaging Studies: No results found.  ED COURSE and MDM  Nursing notes, initial and  subsequent vitals signs, including pulse oximetry, reviewed and interpreted by myself.  Vitals:   12/24/21 0230 12/24/21 0245 12/24/21 0300 12/24/21 0330  BP: 122/89 116/89 108/76 92/66  Pulse: (!) 106 (!) 107 (!) 103 97  Resp: 20 18 18 18   Temp:      SpO2: 97% 95% 96% 95%  Weight:      Height:       Medications  famotidine (PEPCID) IVPB 20 mg premix (0 mg Intravenous Stopped 12/24/21 0305)  dexamethasone (DECADRON) injection 10 mg (10 mg Intravenous Given 12/24/21 0233)   4:17 AM Hives continue to fade.  Patient not itching.  He still has some mild sensation of throat tightness but is having no difficulty breathing, speaking or swallowing.  He was advised to continue taking Benadryl 50 mg every 6 hours for the next 24 to 48 hours to help prevent recurrence.  The dexamethasone he was given should start working within the next few hours as well.  We will give him an EpiPen should he have a recurrence.   PROCEDURES  Procedures   ED DIAGNOSES     ICD-10-CM   1. Allergic reaction, initial encounter  T78.40XA          Ayako Tapanes, 12/26/21, MD 12/24/21 [phone]

## 2021-12-24 NOTE — ED Triage Notes (Addendum)
Pt c/o hives on bilateral legs, SHOB, and tightness in his throat since 0100. Pt took benadryl x 1 hor ago.

## 2021-12-30 ENCOUNTER — Encounter: Payer: Self-pay | Admitting: Family Medicine

## 2021-12-30 ENCOUNTER — Ambulatory Visit: Payer: BC Managed Care – PPO | Admitting: Family Medicine

## 2021-12-30 VITALS — BP 120/80 | HR 114 | Temp 99.0°F | Wt 182.0 lb

## 2021-12-30 DIAGNOSIS — T782XXA Anaphylactic shock, unspecified, initial encounter: Secondary | ICD-10-CM | POA: Diagnosis not present

## 2021-12-30 DIAGNOSIS — Z716 Tobacco abuse counseling: Secondary | ICD-10-CM | POA: Diagnosis not present

## 2021-12-30 NOTE — Progress Notes (Signed)
   Subjective:    Patient ID: David Jensen, male    DOB: 1960/06/06, 61 y.o.   MRN: [id number]  HPI He is here for consult concerning recent visit to the emergency room September 27.  He was awakened early in the morning with itching and hives and was sent to the emergency room.  The emergency room record was reviewed.  No particular etiology was identified.  Talking to him at this point he notes no new medications, exposure to any chemicals, or any new foods. He also would like to consult concerning smoking cessation.  He does smoke a pipe very rarely but will need this for insurance purposes.  Review of Systems     Objective:   Physical Exam Alert and in no distress. Tympanic membranes and canals are normal. Pharyngeal area is normal. Neck is supple without adenopathy or thyromegaly. Cardiac exam shows a regular sinus rhythm without murmurs or gallops. Lungs are clear to auscultation.  ER record was reviewed.      Assessment & Plan:  Anaphylaxis, initial encounter  Encounter for smoking cessation counseling At this point there is no etiology behind his anaphylactic reaction.  Discussed him keeping track of any new foods, medications, or exposures.  Also discussed smoking cessation with him.  At this point he uses a pipe rarely

## 2022-01-08 ENCOUNTER — Encounter: Payer: Self-pay | Admitting: Family Medicine

## 2022-01-19 ENCOUNTER — Encounter: Payer: Self-pay | Admitting: Internal Medicine

## 2022-01-19 NOTE — Telephone Encounter (Signed)
This encounter was created in error - please disregard.

## 2022-02-09 ENCOUNTER — Encounter: Payer: Self-pay | Admitting: Family Medicine

## 2022-07-09 IMAGING — DX DG LUMBAR SPINE COMPLETE 4+V
5 series · 5 of 5 positions shown · non-contrast
Comparison: None.

CLINICAL DATA: Atraumatic lower back pain x 10 days.

EXAM:
LUMBAR SPINE - COMPLETE 4+ VIEW

[dg lumbar spine complete 4 +v (1 of 5)]
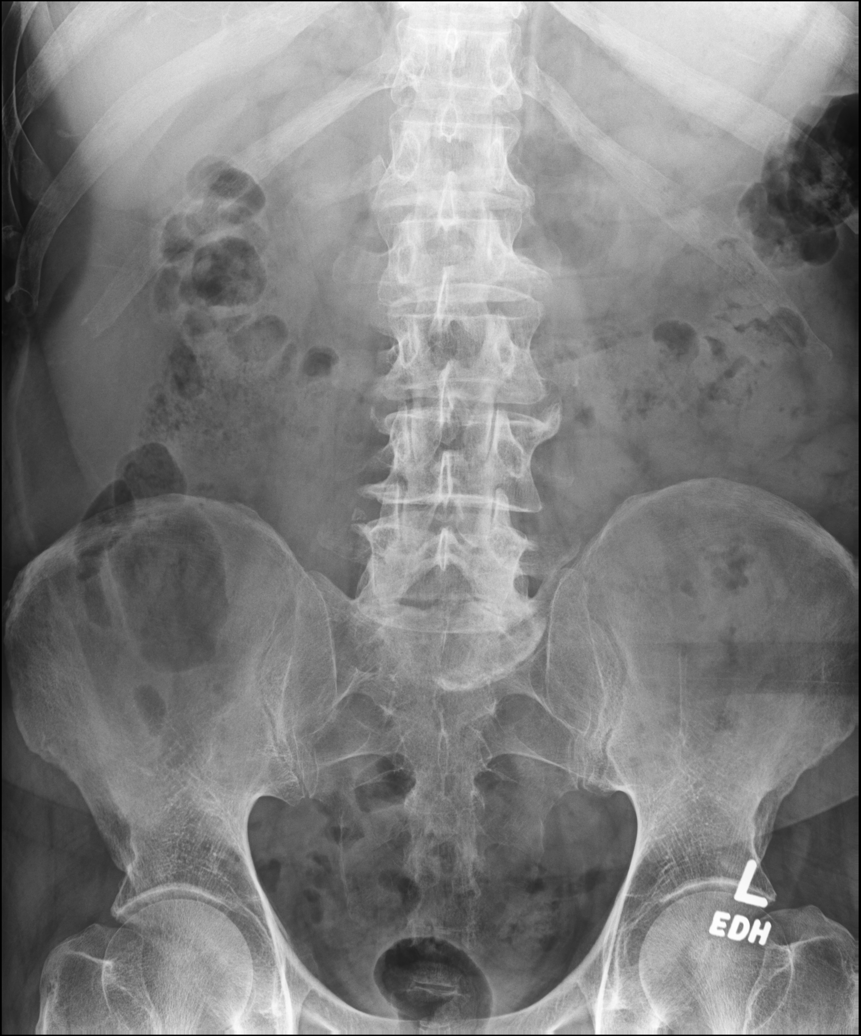

[dg lumbar spine complete 4 +v (2 of 5)]
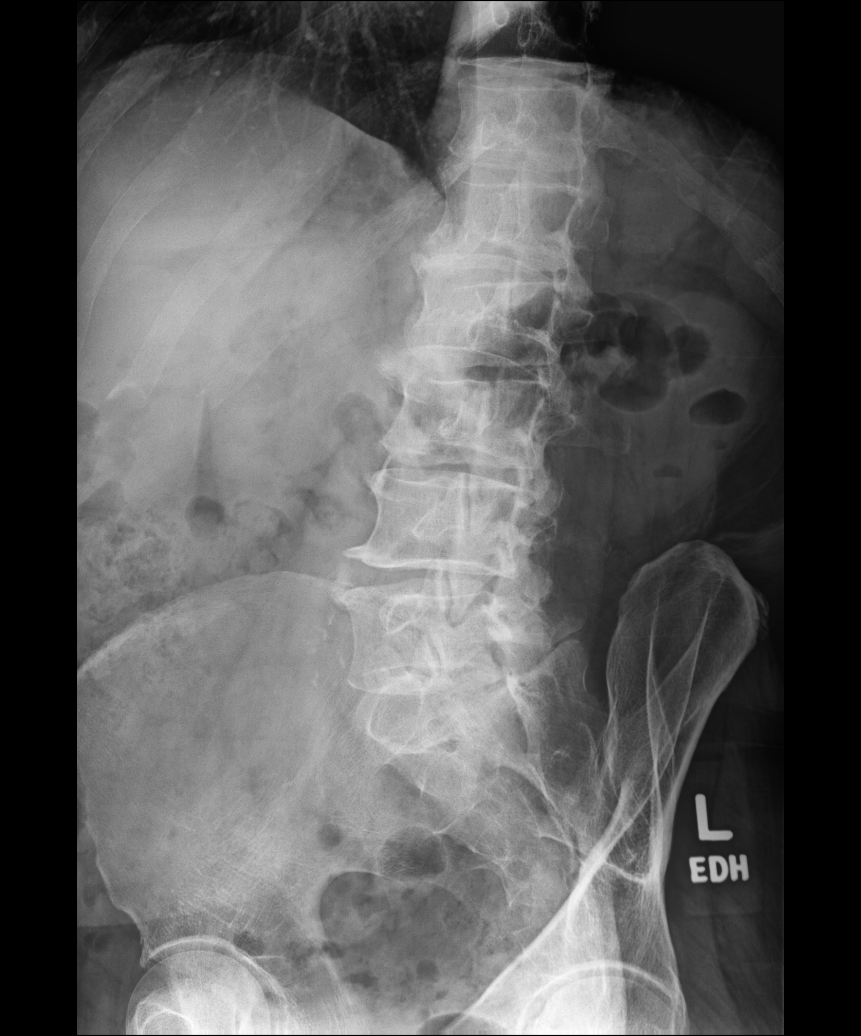

[dg lumbar spine complete 4 +v (3 of 5)]
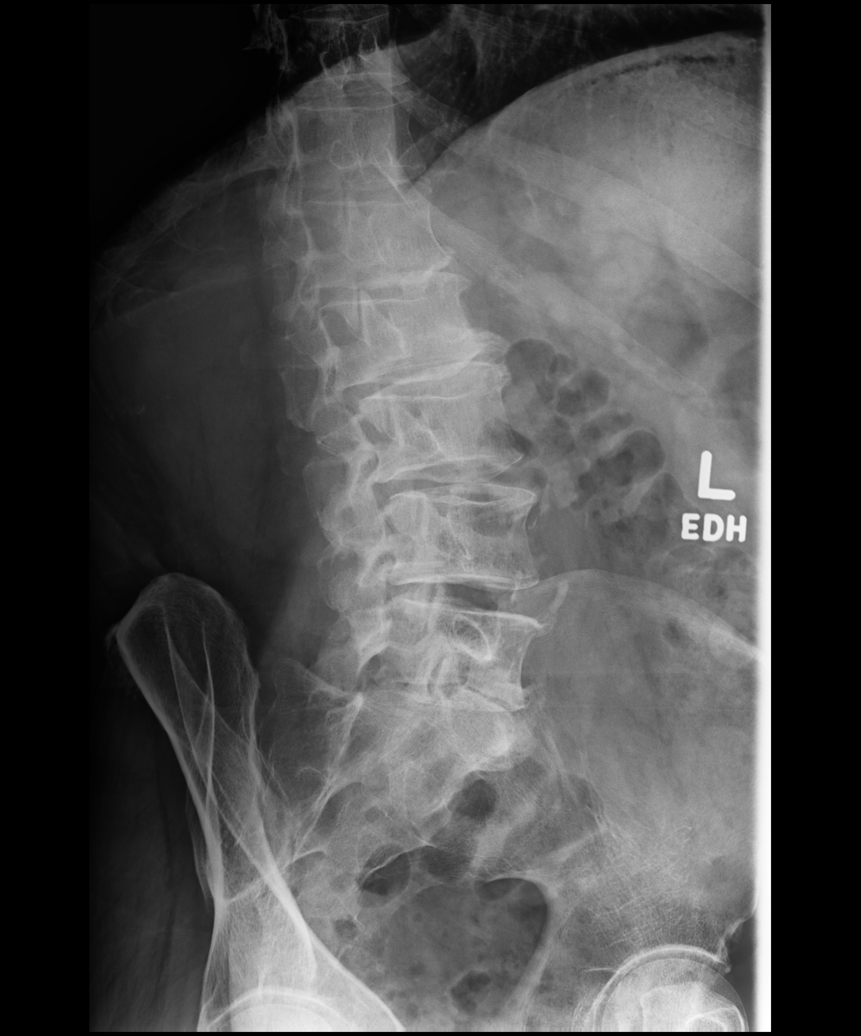

[dg lumbar spine complete 4 +v (4 of 5)]
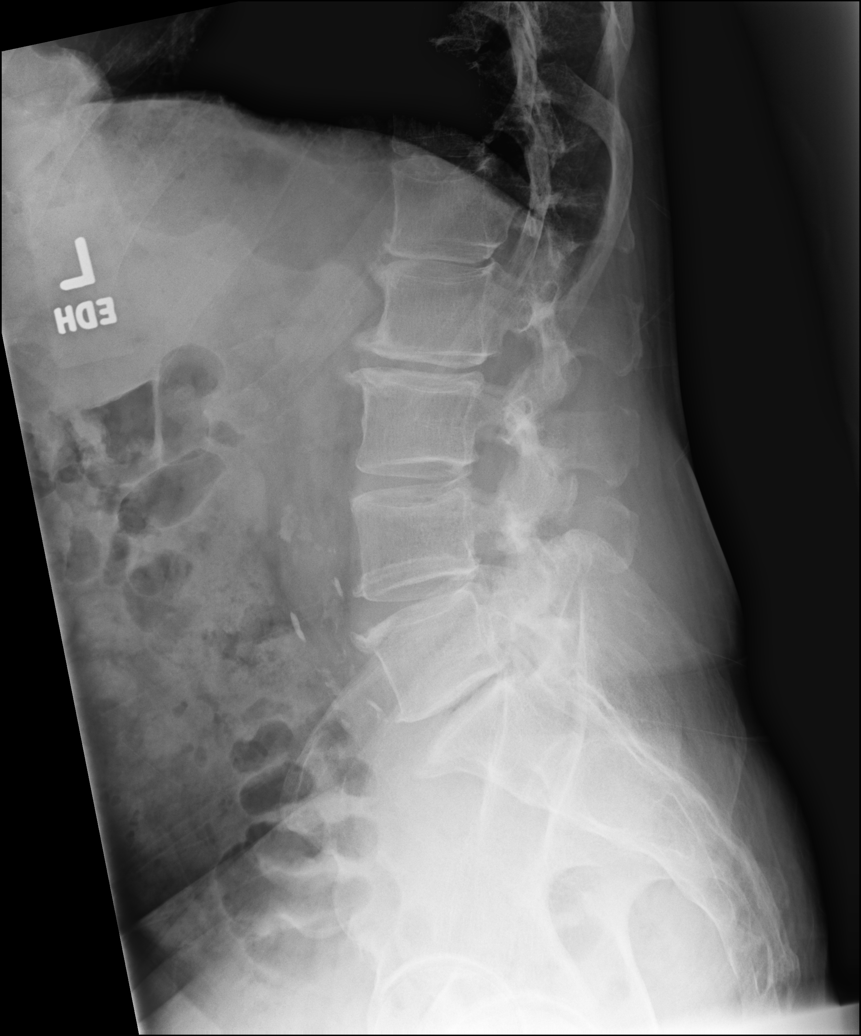

[dg lumbar spine complete 4 +v (5 of 5)]
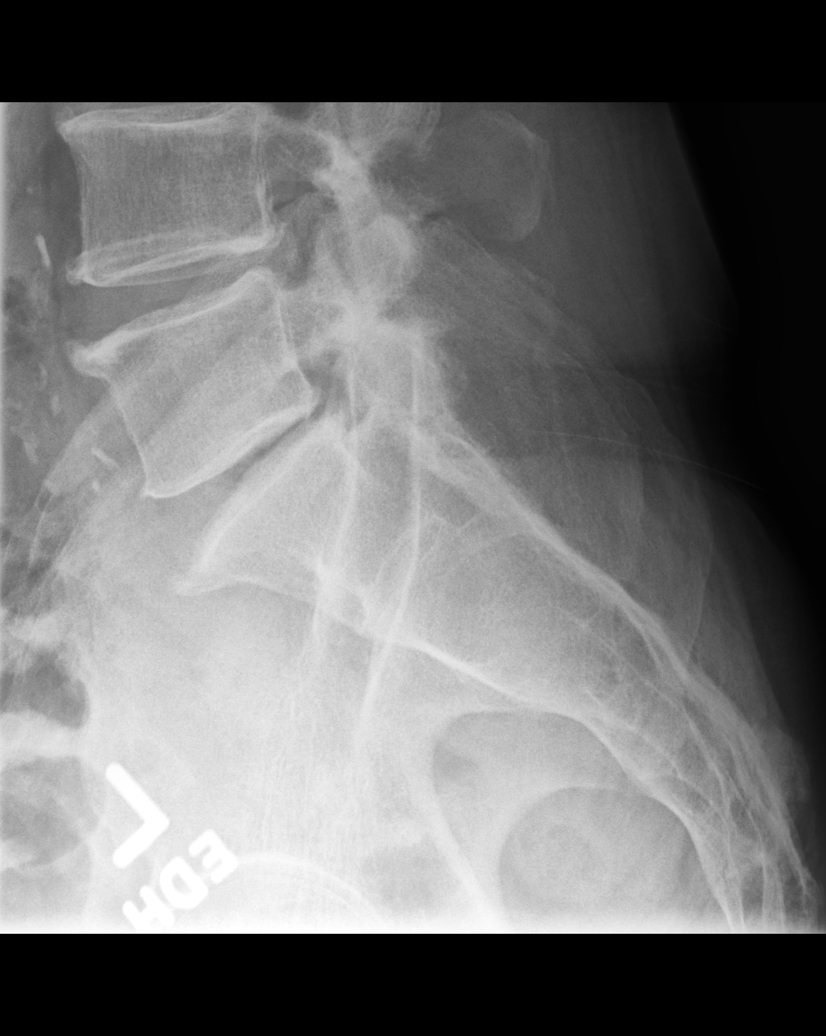

[5 of 5 positions shown; findings below may reference images not displayed]

FINDINGS: There is no evidence of lumbar spine fracture. There is very mild
levoscoliosis. Approximately 1 mm to 2 mm retrolisthesis of the L4
vertebral body is seen on L5. Mild endplate sclerosis is seen at the
level of L3-L4, with moderate to marked severity endplate sclerosis
noted throughout the remainder of the lumbar spine. Moderate
severity multilevel intervertebral disc space narrowing is seen.
IMPRESSION: 1. Moderate to marked severity multilevel degenerative changes.

## 2022-07-17 ENCOUNTER — Encounter: Payer: BC Managed Care – PPO | Admitting: Family Medicine

## 2022-07-22 ENCOUNTER — Encounter: Payer: Self-pay | Admitting: Family Medicine

## 2022-07-22 ENCOUNTER — Ambulatory Visit: Payer: BC Managed Care – PPO | Admitting: Family Medicine

## 2022-07-22 VITALS — BP 124/86 | HR 95 | Temp 97.9°F | Resp 16 | Ht 73.75 in | Wt 179.0 lb

## 2022-07-22 DIAGNOSIS — Z125 Encounter for screening for malignant neoplasm of prostate: Secondary | ICD-10-CM

## 2022-07-22 DIAGNOSIS — M7581 Other shoulder lesions, right shoulder: Secondary | ICD-10-CM

## 2022-07-22 DIAGNOSIS — E785 Hyperlipidemia, unspecified: Secondary | ICD-10-CM | POA: Diagnosis not present

## 2022-07-22 DIAGNOSIS — L821 Other seborrheic keratosis: Secondary | ICD-10-CM

## 2022-07-22 DIAGNOSIS — F17200 Nicotine dependence, unspecified, uncomplicated: Secondary | ICD-10-CM | POA: Diagnosis not present

## 2022-07-22 DIAGNOSIS — F900 Attention-deficit hyperactivity disorder, predominantly inattentive type: Secondary | ICD-10-CM

## 2022-07-22 DIAGNOSIS — Z0000 Encounter for general adult medical examination without abnormal findings: Secondary | ICD-10-CM

## 2022-07-22 DIAGNOSIS — Z23 Encounter for immunization: Secondary | ICD-10-CM

## 2022-07-22 DIAGNOSIS — Z1211 Encounter for screening for malignant neoplasm of colon: Secondary | ICD-10-CM

## 2022-07-22 DIAGNOSIS — N529 Male erectile dysfunction, unspecified: Secondary | ICD-10-CM

## 2022-07-22 DIAGNOSIS — D692 Other nonthrombocytopenic purpura: Secondary | ICD-10-CM | POA: Diagnosis not present

## 2022-07-22 DIAGNOSIS — R931 Abnormal findings on diagnostic imaging of heart and coronary circulation: Secondary | ICD-10-CM | POA: Diagnosis not present

## 2022-07-22 DIAGNOSIS — Z8249 Family history of ischemic heart disease and other diseases of the circulatory system: Secondary | ICD-10-CM

## 2022-07-22 LAB — LIPID PANEL

## 2022-07-22 LAB — POCT URINALYSIS DIP (PROADVANTAGE DEVICE)
Blood, UA: NEGATIVE
Glucose, UA: NEGATIVE mg/dL
Leukocytes, UA: NEGATIVE
Nitrite, UA: NEGATIVE
Protein Ur, POC: NEGATIVE mg/dL
Specific Gravity, Urine: 1.025
Urobilinogen, Ur: 0.2
pH, UA: 6 (ref 5.0–8.0)

## 2022-07-22 LAB — PSA

## 2022-07-22 NOTE — Progress Notes (Signed)
Complete physical exam  Patient: David Jensen   DOB: 19-Sep-1960   62 y.o. Male  MRN: [id number]  Subjective:    Chief Complaint  Patient presents with   Annual Exam    Fasting. No additional concerns.    David Jensen is a 62 y.o. male who presents today for a complete physical exam. He reports consuming a general diet. The patient does not participate in regular exercise at present. He generally feels fairly well. He reports sleeping fairly well. He does have additional problems to discuss today (right shoulder pain).  He was seen in an urgent care center and treated with steroids as well as muscle relaxers.  He does state that he is somewhat better.  He would like me to check his shoulder out.  His mother recently died and he is dealing with that at the present time.  He does have a family history of heart disease and also elevated calcium score but has been seen in the past by cardiology.  Presently is having no chest pain, shortness of breath, PND or DOE.  He continues to smoke his pipe.  He drinks 3 alcoholic beverages a day.  He is dating someone.  His son is about to get married.  He continues on atorvastatin as well as OTC medications and is having no difficulty with them.  He does have underlying ADHD but is not on any medicines at the present time.  He does have a history of PAD but is presently not taking any medicines on a count of a regular basis.  Otherwise his family and social history as well as health maintenance and immunizations was reviewed. Most recent fall risk assessment:    07/22/2022    9:32 AM  Fall Risk   Falls in the past year? 0  Number falls in past yr: 0  Injury with Fall? 0  Risk for fall due to : No Fall Risks  Follow up Falls evaluation completed     Most recent depression screenings:    07/22/2022    9:31 AM 07/11/2021    9:28 AM  PHQ 2/9 Scores  PHQ - 2 Score 0 0    Vision:Not within last year  and Dental: Receives regular dental  care    Patient Care Team: Ronnald Nian, MD as PCP - General (Family Medicine)   Outpatient Medications Prior to Visit  Medication Sig   aspirin EC 81 MG tablet Take 1 tablet (81 mg total) by mouth daily.   atorvastatin (LIPITOR) 40 MG tablet Take 1 tablet (40 mg total) by mouth daily.   EPINEPHrine 0.3 mg/0.3 mL IJ SOAJ injection Self inject per package instructions as needed for severe allergic reaction and seek medical attention.   Multiple Vitamins-Minerals (MULTIVITAMIN WITH MINERALS) tablet Take 1 tablet by mouth daily.   Omega-3 Fatty Acids (FISH OIL PO) Take by mouth.   No facility-administered medications prior to visit.    Review of Systems  All other systems reviewed and are negative.         Objective:     BP 124/86   Pulse 95   Temp 97.9 F (36.6 C) (Oral)   Resp 16   Ht 6' 1.75" (1.873 m)   Wt 179 lb (81.2 kg)   SpO2 95% Comment: room air  BMI 23.14 kg/m    Physical Exam  Alert and in no distress. Tympanic membranes and canals are normal. Pharyngeal area is normal. Neck is supple without adenopathy  or thyromegaly. Cardiac exam shows a regular sinus rhythm without murmurs or gallops. Lungs are clear to auscultation.  Skin exam does show purpuric lesions on his forearms.  Exam of the right shoulder shows no laxity.  Negative sulcus sign.  Neer's and Hawkins test was uncomfortable. Seborrheic keratoses noted on the right side of the face.     Assessment & Plan:    Routine general medical examination at a health care facility - Plan: CBC with Differential/Platelet, Comprehensive metabolic panel, Lipid panel, POCT Urinalysis DIP (Proadvantage Device)  Senile purpura - Plan: CBC with Differential/Platelet, Comprehensive metabolic panel  Elevated coronary artery calcium score - Plan: CBC with Differential/Platelet, Comprehensive metabolic panel, Lipid panel  Smoker  Hyperlipidemia LDL goal <100 - Plan: Lipid panel  Family history of heart disease in  male family member before age 53 - Plan: Lipid panel  Erectile dysfunction, unspecified erectile dysfunction type  Attention deficit hyperactivity disorder (ADHD), predominantly inattentive type  Screening for colon cancer - Plan: Cologuard  Screening for prostate cancer - Plan: PSA  Need for COVID-19 vaccine - Plan: Pfizer Fall 2023 Covid-19 Vaccine 16yrs and older  Tendinitis of right rotator cuff - Plan: Ambulatory referral to Physical Therapy  Seborrheic keratosis  Immunization History  Administered Date(s) Administered   COVID-19, mRNA, vaccine(Comirnaty)12 years and older 07/22/2022   DTaP 09/04/2005   Hepatitis B, ADULT 01/11/2014, 04/11/2014   Influenza Split 02/11/2011   Influenza,inj,Quad PF,6+ Mos 02/02/2013, 11/24/2013, 03/14/2018, 12/21/2018, 12/07/2019, 12/23/2020   Influenza-Unspecified 04/27/2017, 01/08/2022   PFIZER(Purple Top)SARS-COV-2 Vaccination 06/10/2019, 07/04/2019, 01/23/2020   PPD Test 03/10/1985   Pfizer Covid-19 Vaccine Bivalent Booster 6yrs & up 12/23/2020   Tdap 06/11/2015   Zoster Recombinat (Shingrix) 09/07/2019, 12/07/2019    Health Maintenance  Topic Date Due   Fecal DNA (Cologuard)  07/03/2022   INFLUENZA VACCINE  10/29/2022   DTaP/Tdap/Td (3 - Td or Tdap) 06/10/2025   COVID-19 Vaccine  Completed   Hepatitis C Screening  Completed   HIV Screening  Completed   Zoster Vaccines- Shingrix  Completed   HPV VACCINES  Aged Out   COLONOSCOPY (Pts 45-47yrs Insurance coverage will need to be confirmed)  Discontinued    Discussed referral to physical therapy for his shoulder and if continued difficulty come back here.  I will consider giving him a shot to see if that would help if needed. Continue on her present medication regimen. Cautioned him to cut back on his alcohol consumption to 1 or max 2.  Also briefly discussed the smoking however he smokes a pipe and is not interested in stopping. Discussed possible referral back to cardiology at  some time in the future. He will call when he needs his ED meds or his ADD medications. Problem List Items Addressed This Visit     ADHD (attention deficit hyperactivity disorder)   Elevated coronary artery calcium score   Relevant Orders   CBC with Differential/Platelet   Comprehensive metabolic panel   Lipid panel   Erectile dysfunction   Family history of heart disease in male family member before age 67   Relevant Orders   Lipid panel   Hyperlipidemia LDL goal <100   Relevant Orders   Lipid panel   Senile purpura   Relevant Orders   CBC with Differential/Platelet   Comprehensive metabolic panel   Smoker   Other Visit Diagnoses     Routine general medical examination at a health care facility    -  Primary   Relevant Orders  CBC with Differential/Platelet   Comprehensive metabolic panel   Lipid panel   POCT Urinalysis DIP (Proadvantage Device) (Completed)   Screening for colon cancer       Relevant Orders   Cologuard   Screening for prostate cancer       Relevant Orders   PSA   Need for COVID-19 vaccine       Relevant Orders   Pfizer Fall 2023 Covid-19 Vaccine 11yrs and older (Completed)   Tendinitis of right rotator cuff       Relevant Orders   Ambulatory referral to Physical Therapy   Seborrheic keratosis          He will follow-up with me if he has continued difficulty with his shoulder.    Sharlot Gowda, MD

## 2022-07-23 LAB — LIPID PANEL
Cholesterol, Total: 135 mg/dL (ref 100–199)
HDL: 70 mg/dL (ref >39)
LDL Chol Calc (NIH): 55 mg/dL (ref 0–99)

## 2022-07-23 LAB — CBC WITH DIFFERENTIAL/PLATELET
Basophils Absolute: 0.1 10*3/uL (ref 0.0–0.2)
Basos: 1 %
EOS (ABSOLUTE): 0.2 10*3/uL (ref 0.0–0.4)
Eos: 2 %
Hematocrit: 45.3 % (ref 37.5–51.0)
Hemoglobin: 16 g/dL (ref 13.0–17.7)
Immature Grans (Abs): 0 10*3/uL (ref 0.0–0.1)
Immature Granulocytes: 0 %
Lymphocytes Absolute: 1.3 10*3/uL (ref 0.7–3.1)
Lymphs: 13 %
MCH: 36 pg — ABNORMAL HIGH (ref 26.6–33.0)
MCHC: 35.3 g/dL (ref 31.5–35.7)
MCV: 102 fL — ABNORMAL HIGH (ref 79–97)
Monocytes Absolute: 0.7 10*3/uL (ref 0.1–0.9)
Monocytes: 7 %
Neutrophils Absolute: 7.9 10*3/uL — ABNORMAL HIGH (ref 1.4–7.0)
Neutrophils: 77 %
Platelets: 145 10*3/uL — ABNORMAL LOW (ref 150–450)
RBC: 4.45 x10E6/uL (ref 4.14–5.80)
RDW: 11.8 % (ref 11.6–15.4)
WBC: 10.3 10*3/uL (ref 3.4–10.8)

## 2022-07-23 LAB — COMPREHENSIVE METABOLIC PANEL
ALT: 19 IU/L (ref 0–44)
AST: 23 IU/L (ref 0–40)
Albumin/Globulin Ratio: 1.8 (ref 1.2–2.2)
Albumin: 4.2 g/dL (ref 3.9–4.9)
Alkaline Phosphatase: 54 IU/L (ref 44–121)
BUN/Creatinine Ratio: 24 (ref 10–24)
BUN: 22 mg/dL (ref 8–27)
Bilirubin Total: 0.6 mg/dL (ref 0.0–1.2)
CO2: 22 mmol/L (ref 20–29)
Calcium: 9.3 mg/dL (ref 8.6–10.2)
Chloride: 103 mmol/L (ref 96–106)
Creatinine, Ser: 0.92 mg/dL (ref 0.76–1.27)
Globulin, Total: 2.4 g/dL (ref 1.5–4.5)
Glucose: 96 mg/dL (ref 70–99)
Potassium: 4.9 mmol/L (ref 3.5–5.2)
Sodium: 141 mmol/L (ref 134–144)
Total Protein: 6.6 g/dL (ref 6.0–8.5)
eGFR: 93 mL/min/{1.73_m2} (ref >59)

## 2022-07-28 ENCOUNTER — Other Ambulatory Visit: Payer: Self-pay | Admitting: Family Medicine

## 2022-07-28 DIAGNOSIS — R931 Abnormal findings on diagnostic imaging of heart and coronary circulation: Secondary | ICD-10-CM

## 2022-07-28 DIAGNOSIS — E785 Hyperlipidemia, unspecified: Secondary | ICD-10-CM

## 2022-07-28 DIAGNOSIS — Z8249 Family history of ischemic heart disease and other diseases of the circulatory system: Secondary | ICD-10-CM

## 2022-09-03 LAB — COLOGUARD: COLOGUARD: NEGATIVE

## 2023-05-25 ENCOUNTER — Encounter: Payer: Self-pay | Admitting: Internal Medicine

## 2023-08-03 ENCOUNTER — Encounter: Payer: Self-pay | Admitting: Family Medicine

## 2023-08-04 ENCOUNTER — Ambulatory Visit: Payer: BC Managed Care – PPO | Admitting: Family Medicine

## 2023-08-04 ENCOUNTER — Encounter: Payer: Self-pay | Admitting: Family Medicine

## 2023-08-04 VITALS — BP 128/70 | HR 95 | Ht 74.0 in | Wt 176.0 lb

## 2023-08-04 DIAGNOSIS — Z8249 Family history of ischemic heart disease and other diseases of the circulatory system: Secondary | ICD-10-CM | POA: Diagnosis not present

## 2023-08-04 DIAGNOSIS — S20364A Insect bite (nonvenomous) of middle front wall of thorax, initial encounter: Secondary | ICD-10-CM

## 2023-08-04 DIAGNOSIS — D692 Other nonthrombocytopenic purpura: Secondary | ICD-10-CM | POA: Diagnosis not present

## 2023-08-04 DIAGNOSIS — Z0000 Encounter for general adult medical examination without abnormal findings: Secondary | ICD-10-CM

## 2023-08-04 DIAGNOSIS — R931 Abnormal findings on diagnostic imaging of heart and coronary circulation: Secondary | ICD-10-CM

## 2023-08-04 DIAGNOSIS — F17200 Nicotine dependence, unspecified, uncomplicated: Secondary | ICD-10-CM | POA: Diagnosis not present

## 2023-08-04 DIAGNOSIS — E785 Hyperlipidemia, unspecified: Secondary | ICD-10-CM | POA: Diagnosis not present

## 2023-08-04 DIAGNOSIS — Z125 Encounter for screening for malignant neoplasm of prostate: Secondary | ICD-10-CM

## 2023-08-04 DIAGNOSIS — Z23 Encounter for immunization: Secondary | ICD-10-CM

## 2023-08-04 DIAGNOSIS — F900 Attention-deficit hyperactivity disorder, predominantly inattentive type: Secondary | ICD-10-CM

## 2023-08-04 DIAGNOSIS — W57XXXA Bitten or stung by nonvenomous insect and other nonvenomous arthropods, initial encounter: Secondary | ICD-10-CM

## 2023-08-04 DIAGNOSIS — N5201 Erectile dysfunction due to arterial insufficiency: Secondary | ICD-10-CM

## 2023-08-04 LAB — LIPID PANEL

## 2023-08-04 MED ORDER — ATORVASTATIN CALCIUM 40 MG PO TABS: 40.0000 mg | ORAL_TABLET | Freq: Every day | ORAL | 3 refills | Status: AC

## 2023-08-04 MED ORDER — DOXYCYCLINE HYCLATE 100 MG PO TABS: 100.0000 mg | ORAL_TABLET | Freq: Two times a day (BID) | ORAL | 0 refills | Status: AC

## 2023-08-04 NOTE — Progress Notes (Signed)
 Complete physical exam  Patient: David Jensen   DOB: 1960/04/03   63 y.o. Male  MRN: [id number]  Subjective:    Chief Complaint  Patient presents with   Annual Exam    Cpe. Fasting. Wants to check on tick bite. Noticed the tick yesterday morning.     David Jensen is a 63 y.o. male who presents today for a complete physical exam.  He reports consuming a general diet.  Plays with new puppy  He generally feels well. He reports sleeping well.  He continues to smoke his pipe.  He recently was bitten by a [place] tick and has concerns about this.  He does have underlying ED but states that now things are working fairly well.  He is on Lipitor and is having no difficulty with that.  He is involved in a relationship which seems to be going fairly well.  He does have underlying ADHD and uses medicines on an as-needed basis.  He does have an elevated coronary calcium  score and has seen cardiology in the past.  Presently he is having no chest pain, shortness of breath, PND or DOE.  Most recent fall risk assessment:    08/04/2023    9:24 AM  Fall Risk   Falls in the past year? 1  Number falls in past yr: 1  Injury with Fall? 1  Risk for fall due to : History of fall(s)  Follow up Falls evaluation completed     Most recent depression screenings:    08/04/2023    9:24 AM 07/22/2022    9:31 AM  PHQ 2/9 Scores  PHQ - 2 Score 0 0    Vision:within 6 months and Dental: No current dental problems and Last dental visit: April 2025    Immunization History  Administered Date(s) Administered   DTaP 09/04/2005   Hepatitis B, ADULT 01/11/2014, 04/11/2014   Influenza Split 02/11/2011   Influenza,inj,Quad PF,6+ Mos 02/02/2013, 11/24/2013, 03/14/2018, 12/21/2018, 12/07/2019, 12/23/2020   Influenza-Unspecified 04/27/2017, 01/08/2022   PFIZER(Purple Top)SARS-COV-2 Vaccination 06/10/2019, 07/04/2019, 01/23/2020   PNEUMOCOCCAL CONJUGATE-20 08/04/2023   PPD Test 03/10/1985   Pfizer Covid-19 Vaccine  Bivalent Booster 80yrs & up 12/23/2020   Pfizer(Comirnaty)Fall Seasonal Vaccine 12 years and older 07/22/2022   Tdap 06/11/2015   Zoster Recombinant(Shingrix) 09/07/2019, 12/07/2019    Health Maintenance  Topic Date Due   COVID-19 Vaccine (6 - 2024-25 season) 11/29/2022   INFLUENZA VACCINE  10/29/2023   DTaP/Tdap/Td (3 - Td or Tdap) 06/10/2025   Fecal DNA (Cologuard)  08/27/2025   Pneumococcal Vaccine 75-109 Years old  Completed   Hepatitis C Screening  Completed   HIV Screening  Completed   Zoster Vaccines- Shingrix  Completed   HPV VACCINES  Aged Out   Meningococcal B Vaccine  Aged Out   Colonoscopy  Discontinued    Patient Care Team: Watson Hacking, MD as PCP - General (Family Medicine)   Outpatient Medications Prior to Visit  Medication Sig   aspirin  EC 81 MG tablet Take 1 tablet (81 mg total) by mouth daily.   calcium -vitamin D (OSCAL WITH D) 500-5 MG-MCG tablet Take 1 tablet by mouth.   co-enzyme Q-10 30 MG capsule Take 30 mg by mouth 3 (three) times daily.   EPINEPHrine  0.3 mg/0.3 mL IJ SOAJ injection Self inject per package instructions as needed for severe allergic reaction and seek medical attention.   Melatonin 10 MG TABS Take by mouth.   Multiple Vitamins-Minerals (MULTIVITAMIN WITH MINERALS) tablet Take  1 tablet by mouth daily.   Omega-3 Fatty Acids (FISH OIL PO) Take by mouth.   [DISCONTINUED] atorvastatin  (LIPITOR) 40 MG tablet TAKE ONE TABLET BY MOUTH DAILY   No facility-administered medications prior to visit.    Review of Systems  All other systems reviewed and are negative.   Family and social history as well as health maintenance and immunizations was reviewed.     Objective:      Physical Exam  Alert and in no distress. Tympanic membranes and canals are normal. Pharyngeal area is normal. Neck is supple without adenopathy or thyromegaly. Cardiac exam shows a regular sinus rhythm without murmurs or gallops. Lungs are clear to auscultation. He does  have evidence of a bite on the left flank with surrounding erythema of approximately 3 cm.     Assessment & Plan:     Routine general medical examination at a health care facility  Smoker  Senile purpura (HCC)  Hyperlipidemia LDL goal <100 - Plan: Lipid panel, atorvastatin  (LIPITOR) 40 MG tablet  Family history of heart disease in male family member before age 72 - Plan: atorvastatin  (LIPITOR) 40 MG tablet  Erectile dysfunction due to arterial insufficiency - Plan: CBC with Differential/Platelet, Comprehensive metabolic panel with GFR  Attention deficit hyperactivity disorder (ADHD), predominantly inattentive type  Elevated coronary artery calcium  score - Plan: atorvastatin  (LIPITOR) 40 MG tablet  Need for vaccination against Streptococcus pneumoniae - Plan: Pneumococcal conjugate vaccine 20-valent (Prevnar 20)  Tick bite of middle front wall of thorax, initial encounter - Plan: doxycycline (VIBRA-TABS) 100 MG tablet  Screening for prostate cancer - Plan: PSA  I did give him doxycycline and have him take 1 dose however if the rash grows in size he will need to continue on the antibiotic for a longer period of time as this could also be an early cellulitis.  He is aware of this and will keep me informed.  Discussed alpha gal with him. Return in about 1 year (around 08/03/2024).      David Cobbs, MD

## 2023-08-05 ENCOUNTER — Encounter: Payer: Self-pay | Admitting: Family Medicine

## 2023-08-05 LAB — CBC WITH DIFFERENTIAL/PLATELET
Basophils Absolute: 0.1 10*3/uL (ref 0.0–0.2)
Basos: 1 %
EOS (ABSOLUTE): 0.3 10*3/uL (ref 0.0–0.4)
Eos: 3 %
Hematocrit: 47.4 % (ref 37.5–51.0)
Hemoglobin: 16.2 g/dL (ref 13.0–17.7)
Immature Grans (Abs): 0 10*3/uL (ref 0.0–0.1)
Immature Granulocytes: 0 %
Lymphocytes Absolute: 1.3 10*3/uL (ref 0.7–3.1)
Lymphs: 16 %
MCH: 35.7 pg — ABNORMAL HIGH (ref 26.6–33.0)
MCHC: 34.2 g/dL (ref 31.5–35.7)
MCV: 104 fL — ABNORMAL HIGH (ref 79–97)
Monocytes Absolute: 0.6 10*3/uL (ref 0.1–0.9)
Monocytes: 8 %
Neutrophils Absolute: 5.5 10*3/uL (ref 1.4–7.0)
Neutrophils: 72 %
Platelets: 178 10*3/uL (ref 150–450)
RBC: 4.54 x10E6/uL (ref 4.14–5.80)
RDW: 12.1 % (ref 11.6–15.4)
WBC: 7.7 10*3/uL (ref 3.4–10.8)

## 2023-08-05 LAB — LIPID PANEL
Cholesterol, Total: 162 mg/dL (ref 100–199)
HDL: 70 mg/dL (ref >39)
LDL CALC COMMENT:: 2.3 ratio (ref 0.0–5.0)
LDL Chol Calc (NIH): 80 mg/dL (ref 0–99)
Triglycerides: 57 mg/dL (ref 0–149)
VLDL Cholesterol Cal: 12 mg/dL (ref 5–40)

## 2023-08-05 LAB — COMPREHENSIVE METABOLIC PANEL WITH GFR
ALT: 22 IU/L (ref 0–44)
AST: 28 IU/L (ref 0–40)
Albumin: 4.7 g/dL (ref 3.9–4.9)
Alkaline Phosphatase: 57 IU/L (ref 44–121)
BUN/Creatinine Ratio: 21 (ref 10–24)
BUN: 19 mg/dL (ref 8–27)
Bilirubin Total: 0.5 mg/dL (ref 0.0–1.2)
CO2: 22 mmol/L (ref 20–29)
Calcium: 9.8 mg/dL (ref 8.6–10.2)
Chloride: 102 mmol/L (ref 96–106)
Creatinine, Ser: 0.92 mg/dL (ref 0.76–1.27)
Globulin, Total: 2.8 g/dL (ref 1.5–4.5)
Glucose: 91 mg/dL (ref 70–99)
Potassium: 5 mmol/L (ref 3.5–5.2)
Sodium: 141 mmol/L (ref 134–144)
Total Protein: 7.5 g/dL (ref 6.0–8.5)
eGFR: 93 mL/min/{1.73_m2} (ref >59)

## 2023-08-05 LAB — PSA: Prostate Specific Ag, Serum: 0.5 ng/mL (ref 0.0–4.0)
